# Patient Record
Sex: Female | Born: 1980
Health system: Southern US, Community
[De-identification: ages and names within clinical notes are randomized; demographics above are authoritative.]

## PROBLEM LIST (undated history)

## (undated) DIAGNOSIS — G43909 Migraine, unspecified, not intractable, without status migrainosus: Secondary | ICD-10-CM

## (undated) DIAGNOSIS — N200 Calculus of kidney: Secondary | ICD-10-CM

## (undated) DIAGNOSIS — R51 Headache: Secondary | ICD-10-CM

## (undated) DIAGNOSIS — F329 Major depressive disorder, single episode, unspecified: Secondary | ICD-10-CM

## (undated) DIAGNOSIS — R635 Abnormal weight gain: Secondary | ICD-10-CM

## (undated) DIAGNOSIS — T7840XA Allergy, unspecified, initial encounter: Secondary | ICD-10-CM

## (undated) DIAGNOSIS — J029 Acute pharyngitis, unspecified: Secondary | ICD-10-CM

## (undated) DIAGNOSIS — I471 Supraventricular tachycardia: Secondary | ICD-10-CM

## (undated) DIAGNOSIS — G8929 Other chronic pain: Secondary | ICD-10-CM

## (undated) DIAGNOSIS — R519 Headache, unspecified: Secondary | ICD-10-CM

## (undated) DIAGNOSIS — R21 Rash and other nonspecific skin eruption: Secondary | ICD-10-CM

## (undated) DIAGNOSIS — R897 Abnormal histological findings in specimens from other organs, systems and tissues: Secondary | ICD-10-CM

## (undated) DIAGNOSIS — F419 Anxiety disorder, unspecified: Secondary | ICD-10-CM

## (undated) DIAGNOSIS — M222X9 Patellofemoral disorders, unspecified knee: Secondary | ICD-10-CM

## (undated) DIAGNOSIS — J209 Acute bronchitis, unspecified: Secondary | ICD-10-CM

## (undated) DIAGNOSIS — M25561 Pain in right knee: Secondary | ICD-10-CM

## (undated) DIAGNOSIS — F32A Depression, unspecified: Secondary | ICD-10-CM

## (undated) HISTORY — DX: Headache: R51

## (undated) HISTORY — DX: Calculus of kidney: N20.0

## (undated) HISTORY — DX: Major depressive disorder, single episode, unspecified: F32.9

## (undated) HISTORY — DX: Other chronic pain: G89.29

## (undated) HISTORY — DX: Headache, unspecified: R51.9

## (undated) HISTORY — DX: Depression, unspecified: F32.A

## (undated) HISTORY — PX: BREAST BIOPSY: SHX20

## (undated) HISTORY — DX: Allergy, unspecified, initial encounter: T78.40XA

## (undated) HISTORY — DX: Migraine, unspecified, not intractable, without status migrainosus: G43.909

---
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
## 2002-11-24 DIAGNOSIS — N200 Calculus of kidney: Secondary | ICD-10-CM

## 2002-11-24 HISTORY — DX: Calculus of kidney: N20.0

## 2006-04-09 ENCOUNTER — Other Ambulatory Visit: Admission: RE | Admit: 2006-04-09 | Discharge: 2006-04-09 | Payer: Self-pay | Admitting: Obstetrics & Gynecology

## 2007-08-30 ENCOUNTER — Ambulatory Visit (HOSPITAL_COMMUNITY): Admission: RE | Admit: 2007-08-30 | Discharge: 2007-08-30 | Payer: Self-pay | Admitting: Obstetrics and Gynecology

## 2007-11-16 ENCOUNTER — Inpatient Hospital Stay (HOSPITAL_COMMUNITY): Admission: RE | Admit: 2007-11-16 | Discharge: 2007-11-18 | Payer: Self-pay | Admitting: Obstetrics and Gynecology

## 2008-12-03 ENCOUNTER — Emergency Department (HOSPITAL_COMMUNITY): Admission: EM | Admit: 2008-12-03 | Discharge: 2008-12-03 | Payer: Self-pay | Admitting: Emergency Medicine

## 2010-05-28 ENCOUNTER — Encounter: Admission: RE | Admit: 2010-05-28 | Discharge: 2010-05-28 | Payer: Self-pay | Admitting: Obstetrics and Gynecology

## 2010-08-22 ENCOUNTER — Ambulatory Visit (HOSPITAL_COMMUNITY): Admission: RE | Admit: 2010-08-22 | Discharge: 2010-08-22 | Payer: Self-pay | Admitting: Obstetrics and Gynecology

## 2011-02-03 ENCOUNTER — Inpatient Hospital Stay (HOSPITAL_COMMUNITY)
Admission: AD | Admit: 2011-02-03 | Discharge: 2011-02-05 | DRG: 775 | Disposition: A | Discharge status: Home / Self Care | Payer: Managed Care, Other (non HMO) | Source: Ambulatory Visit | Attending: Obstetrics and Gynecology | Admitting: Obstetrics and Gynecology

## 2011-02-03 DIAGNOSIS — O9912 Other diseases of the blood and blood-forming organs and certain disorders involving the immune mechanism complicating childbirth: Secondary | ICD-10-CM | POA: Diagnosis present

## 2011-02-03 DIAGNOSIS — D696 Thrombocytopenia, unspecified: Secondary | ICD-10-CM | POA: Diagnosis present

## 2011-02-03 DIAGNOSIS — D689 Coagulation defect, unspecified: Secondary | ICD-10-CM | POA: Diagnosis present

## 2011-02-03 LAB — CBC
HCT: 34.1 % — ABNORMAL LOW (ref 36.0–46.0)
Hemoglobin: 11.6 g/dL — ABNORMAL LOW (ref 12.0–15.0)
MCH: 32.6 pg (ref 26.0–34.0)
MCHC: 34 g/dL (ref 30.0–36.0)
MCV: 95.8 fL (ref 78.0–100.0)
Platelets: 142 10*3/uL — ABNORMAL LOW (ref 150–400)
RDW: 13.1 % (ref 11.5–15.5)
WBC: 7.3 10*3/uL (ref 4.0–10.5)

## 2011-02-04 LAB — CBC
MCH: 32.7 pg (ref 26.0–34.0)
Platelets: 128 10*3/uL — ABNORMAL LOW (ref 150–400)
RBC: 3.18 MIL/uL — ABNORMAL LOW (ref 3.87–5.11)
RDW: 13 % (ref 11.5–15.5)
WBC: 9.6 10*3/uL (ref 4.0–10.5)

## 2011-02-06 NOTE — Discharge Summary (Signed)
  [name], Summer Anderson                 ACCOUNT NO.:  [id number]  MEDICAL RECORD NO.:  [id number]           PATIENT TYPE:  I  LOCATION:  9123                          FACILITY:  [facility]  PHYSICIAN:  Gerrit Friends. Aldona Bar, M.D.   DATE OF BIRTH:  1981/04/07  DATE OF ADMISSION:  02/03/2011 DATE OF DISCHARGE:  02/05/2011                              DISCHARGE SUMMARY   DISCHARGE DIAGNOSES: 1. Term pregnancy, delivered, 7-pound and 8-ounce female infant,     Apgars 9 and 9. 2. Blood type A negative - RhoGAM not given - baby Rh negative.  PROCEDURES: 1. Induction of labor. 2. Normal spontaneous delivery. 3. Second-degree tear and repair.  SUMMARY:  This patient, a 30 year old gravida 2, para 1 was admitted for induction by Dr. Tenny Craw on the morning of February 03, 2011, after a relatively uneventful pregnancy.  She progressed and on the evening of February 03, 2011, had a normal spontaneous delivery of 7-pound and 8-ounce female infant with Apgars of 9 and 9 over a second-degree tear which was repaired without difficulty.  Her postpartum course was benign.  Her discharge hemoglobin was 10.4 with a white count of 9600, and a platelet count of 128,000.  On the morning of February 05, 2011, she was ambulating well, tolerating a regular diet well, having normal bowel and bladder function, was afebrile, was breast-feeding without difficulty, and desirous of discharge.  Accordingly, she was given all appropriate instructions per discharge brochure and understood all instructions well.  DISCHARGE MEDICATIONS: 1. Vitamins 1 a day as long as she is breast-feeding. 2. Equivalent Feosol capsules one 3-4 times a week. 3. She was given prescriptions for Motrin 600 mg to use every 6 hours     as needed for mild pain or cramping. 4. Tylox to use 1-2 every 4-6 hours as needed for more severe pain.  She will return to the office in approximately 4 weeks' time for followup or as needed.  CONDITION ON DISCHARGE:   Improved.     Gerrit Friends. Aldona Bar, M.D.     RMW/MEDQ  D:  02/05/2011  T:  02/06/2011  Job:  [id number]  Electronically Signed by Annamaria Helling M.D. on 02/06/2011 05:38:10 AM

## 2011-02-10 ENCOUNTER — Inpatient Hospital Stay (HOSPITAL_COMMUNITY): Admission: AD | Admit: 2011-02-10 | Payer: Self-pay | Admitting: Obstetrics and Gynecology

## 2011-03-10 LAB — CBC
MCV: 96.8 fL (ref 78.0–100.0)
Platelets: 206 10*3/uL (ref 150–400)
RBC: 3.45 MIL/uL — ABNORMAL LOW (ref 3.87–5.11)
RDW: 12 % (ref 11.5–15.5)

## 2011-03-10 LAB — POCT I-STAT, CHEM 8
Calcium, Ion: 1.18 mmol/L (ref 1.12–1.32)
Creatinine, Ser: 1.2 mg/dL (ref 0.4–1.2)
HCT: 34 % — ABNORMAL LOW (ref 36.0–46.0)
Hemoglobin: 11.6 g/dL — ABNORMAL LOW (ref 12.0–15.0)
Potassium: 4.1 mEq/L (ref 3.5–5.1)
Sodium: 141 mEq/L (ref 135–145)

## 2011-03-10 LAB — DIFFERENTIAL
Lymphocytes Relative: 7 % — ABNORMAL LOW (ref 12–46)
Neutro Abs: 12.6 10*3/uL — ABNORMAL HIGH (ref 1.7–7.7)
Neutrophils Relative %: 88 % — ABNORMAL HIGH (ref 43–77)

## 2011-03-10 LAB — URINALYSIS, ROUTINE W REFLEX MICROSCOPIC
Glucose, UA: NEGATIVE mg/dL
Nitrite: POSITIVE — AB
Protein, ur: 100 mg/dL — AB
Urobilinogen, UA: 1 mg/dL (ref 0.0–1.0)

## 2011-08-29 LAB — URINALYSIS, DIPSTICK ONLY
Bilirubin Urine: NEGATIVE
Glucose, UA: NEGATIVE
Leukocytes, UA: NEGATIVE
Nitrite: NEGATIVE
Protein, ur: NEGATIVE
Specific Gravity, Urine: 1.015

## 2011-08-29 LAB — COMPREHENSIVE METABOLIC PANEL
ALT: 20
Albumin: 2.6 — ABNORMAL LOW
BUN: 9
CO2: 20
Chloride: 104
GFR calc non Af Amer: >60
Glucose, Bld: 86
Total Bilirubin: 0.7
Total Protein: 5.3 — ABNORMAL LOW

## 2011-08-29 LAB — CBC
MCHC: 35
MCHC: 35.2
MCV: 94.1
MCV: 95.6
Platelets: 170
RBC: 3.21 — ABNORMAL LOW
WBC: 17.8 — ABNORMAL HIGH
WBC: 9.5

## 2011-08-29 LAB — LACTATE DEHYDROGENASE: LDH: 160

## 2011-09-04 LAB — RH IMMUNE GLOBULIN WORKUP (NOT WOMEN'S HOSP): Antibody Screen: NEGATIVE

## 2011-09-04 LAB — CBC
HCT: 33.9 — ABNORMAL LOW
MCV: 95.4
Platelets: 232
RBC: 3.56 — ABNORMAL LOW
RDW: 12.4

## 2011-09-04 LAB — RPR: RPR Ser Ql: NONREACTIVE

## 2013-06-16 LAB — HM PAP SMEAR: HM Pap smear: NORMAL

## 2013-06-21 ENCOUNTER — Encounter: Payer: Self-pay | Admitting: Internal Medicine

## 2013-06-21 ENCOUNTER — Ambulatory Visit (INDEPENDENT_AMBULATORY_CARE_PROVIDER_SITE_OTHER): Payer: Managed Care, Other (non HMO) | Admitting: Internal Medicine

## 2013-06-21 VITALS — BP 112/70 | HR 90 | Temp 98.5°F | Ht 64.0 in | Wt 120.8 lb

## 2013-06-21 DIAGNOSIS — F329 Major depressive disorder, single episode, unspecified: Secondary | ICD-10-CM

## 2013-06-21 DIAGNOSIS — G43909 Migraine, unspecified, not intractable, without status migrainosus: Secondary | ICD-10-CM | POA: Insufficient documentation

## 2013-06-21 DIAGNOSIS — Z0000 Encounter for general adult medical examination without abnormal findings: Secondary | ICD-10-CM

## 2013-06-21 MED ORDER — ESCITALOPRAM OXALATE 10 MG PO TABS: 10.0000 mg | ORAL_TABLET | Freq: Every day | ORAL | Status: DC

## 2013-06-21 MED ORDER — LEVONORGESTREL 20 MCG/24HR IU IUD: 1.0000 | INTRAUTERINE_SYSTEM | Freq: Once | INTRAUTERINE | Status: AC

## 2013-06-21 MED ORDER — CLINDAMYCIN-TRETINOIN 1.2-0.025 % EX GEL: Freq: Every day | CUTANEOUS | Status: DC

## 2013-06-21 MED ORDER — ALPRAZOLAM 0.25 MG PO TABS: 0.2500 mg | ORAL_TABLET | Freq: Three times a day (TID) | ORAL | Status: DC | PRN

## 2013-06-21 NOTE — Progress Notes (Signed)
  Subjective:    Patient ID: Summer Anderson, female    DOB: May 05, 1981, 32 y.o.   MRN: [id number]  HPI  patient is here today for annual physical. Patient feels well and has no complaints.  Past Medical History  Diagnosis Date  . Depression   . Migraines   . Kidney stones   . Allergy   . Chronic headaches    Family History  Problem Relation Age of Onset  . Acute myelogenous leukemia Father 29  . Parkinson's disease Maternal Grandfather     uncetain dx   History  Substance Use Topics  . Smoking status: Never Smoker   . 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 Alcohol Use: Yes    Review of Systems Constitutional: Negative for fever or weight change.  Respiratory: Negative for cough and shortness of breath.   Cardiovascular: Negative for chest pain or palpitations.  Gastrointestinal: Negative for abdominal pain, no bowel changes.  Musculoskeletal: Negative for gait problem or joint swelling.  Skin: Negative for rash.  Neurological: Negative for dizziness or change in chronic headache.  No other specific complaints in a complete review of systems (except as listed in HPI above).     Objective:   Physical Exam BP 112/70  Pulse 90  Temp(Src) 98.5 F (36.9 C) (Oral)  Ht 5\' 4"  (1.626 m)  Wt 120 lb 12.8 oz (54.795 kg)  BMI 20.73 kg/m2  SpO2 98% Wt Readings from Last 3 Encounters:  06/21/13 120 lb 12.8 oz (54.795 kg)  Constitutional: She appears well-developed and well-nourished. No distress.  HENT: Head: Normocephalic and atraumatic. Ears: B TMs ok, no erythema or effusion; Nose: Nose normal. Mouth/Throat: Oropharynx is clear and moist. No oropharyngeal exudate.  Eyes: Conjunctivae and EOM are normal. Pupils are equal, round, and reactive to light. No scleral icterus.  Neck: Normal range of motion. Neck supple. No JVD present. No thyromegaly present.  Cardiovascular: Normal rate, regular rhythm and normal  heart sounds.  No murmur heard. No BLE edema. Pulmonary/Chest: Effort normal and breath sounds normal. No respiratory distress. She has no wheezes.  Abdominal: Soft. Bowel sounds are normal. She exhibits no distension. There is no tenderness. no masses Musculoskeletal: Normal range of motion, no joint effusions. No gross deformities Neurological: She is alert and oriented to person, place, and time. No cranial nerve deficit. Coordination, balance, strength, speech and gait are normal.  Skin: Skin is warm and dry. No rash noted. No erythema.  Psychiatric: She has a normal mood and affect. Her behavior is normal. Judgment and thought content normal.   Lab Results  Component Value Date   WBC 9.6 02/04/2011   HGB 10.4* 02/04/2011   HCT 30.6* 02/04/2011   PLT 128* 02/04/2011   GLUCOSE 107* 12/03/2008   ALT 20 11/16/2007   AST 26 11/16/2007   NA 141 12/03/2008   K 4.1 12/03/2008   CL 110 12/03/2008   CREATININE 1.2 12/03/2008   BUN 14 12/03/2008   CO2 20 11/16/2007        Assessment & Plan:   CPX/v70.0 - Patient has been counseled on age-appropriate routine health concerns for screening and prevention. These are reviewed and up-to-date. Immunizations are up-to-date or declined. Labs ordered and reviewed.   See problem list. Medications and labs reviewed today.

## 2013-06-21 NOTE — Patient Instructions (Signed)
It was good to see you today. We have reviewed your prior records including labs and tests today Health Maintenance reviewed - all recommended immunizations and age-appropriate screenings are up-to-date.  Test(s) ordered today. Your results will be released to MyChart (or called to you) after review, usually within 72hours after test completion. If any changes need to be made, you will be notified at that same time. Medications reviewed and updated, no changes recommended at this time. Refill on medication(s) as discussed today. Please schedule followup in 12 months, call sooner if problems.  Health Maintenance, Females A healthy lifestyle and preventative care can promote health and wellness.  Maintain regular health, dental, and eye exams.  Eat a healthy diet. Foods like vegetables, fruits, whole grains, low-fat dairy products, and lean protein foods contain the nutrients you need without too many calories. Decrease your intake of foods high in solid fats, added sugars, and salt. Get information about a proper diet from your caregiver, if necessary.  Regular physical exercise is one of the most important things you can do for your health. Most adults should get at least 150 minutes of moderate-intensity exercise (any activity that increases your heart rate and causes you to sweat) each week. In addition, most adults need muscle-strengthening exercises on 2 or more days a week.   Maintain a healthy weight. The body mass index (BMI) is a screening tool to identify possible weight problems. It provides an estimate of body fat based on height and weight. Your caregiver can help determine your BMI, and can help you achieve or maintain a healthy weight. For adults 20 years and older:  A BMI below 18.5 is considered underweight.  A BMI of 18.5 to 24.9 is normal.  A BMI of 25 to 29.9 is considered overweight.  A BMI of 30 and above is considered obese.  Maintain normal blood lipids and  cholesterol by exercising and minimizing your intake of saturated fat. Eat a balanced diet with plenty of fruits and vegetables. Blood tests for lipids and cholesterol should begin at age 21 and be repeated every 5 years. If your lipid or cholesterol levels are high, you are over 50, or you are a high risk for heart disease, you may need your cholesterol levels checked more frequently.Ongoing high lipid and cholesterol levels should be treated with medicines if diet and exercise are not effective.  If you smoke, find out from your caregiver how to quit. If you do not use tobacco, do not start.  If you are pregnant, do not drink alcohol. If you are breastfeeding, be very cautious about drinking alcohol. If you are not pregnant and choose to drink alcohol, do not exceed 1 drink per day. One drink is considered to be 12 ounces (355 mL) of beer, 5 ounces (148 mL) of wine, or 1.5 ounces (44 mL) of liquor.  Avoid use of street drugs. Do not share needles with anyone. Ask for help if you need support or instructions about stopping the use of drugs.  High blood pressure causes heart disease and increases the risk of stroke. Blood pressure should be checked at least every 1 to 2 years. Ongoing high blood pressure should be treated with medicines, if weight loss and exercise are not effective.  If you are 77 to 33 years old, ask your caregiver if you should take aspirin to prevent strokes.  Diabetes screening involves taking a blood sample to check your fasting blood sugar level. This should be done once every 3  years, after age 40, if you are within normal weight and without risk factors for diabetes. Testing should be considered at a younger age or be carried out more frequently if you are overweight and have at least 1 risk factor for diabetes.  Breast cancer screening is essential preventative care for women. You should practice "breast self-awareness." This means understanding the normal appearance and  feel of your breasts and may include breast self-examination. Any changes detected, no matter how small, should be reported to a caregiver. Women in their 11s and 30s should have a clinical breast exam (CBE) by a caregiver as part of a regular health exam every 1 to 3 years. After age 77, women should have a CBE every year. Starting at age 15, women should consider having a mammogram (breast X-ray) every year. Women who have a family history of breast cancer should talk to their caregiver about genetic screening. Women at a high risk of breast cancer should talk to their caregiver about having an MRI and a mammogram every year.  The Pap test is a screening test for cervical cancer. Women should have a Pap test starting at age 60. Between ages 25 and 59, Pap tests should be repeated every 2 years. Beginning at age 65, you should have a Pap test every 3 years as long as the past 3 Pap tests have been normal. If you had a hysterectomy for a problem that was not cancer or a condition that could lead to cancer, then you no longer need Pap tests. If you are between ages 74 and 5, and you have had normal Pap tests going back 10 years, you no longer need Pap tests. If you have had past treatment for cervical cancer or a condition that could lead to cancer, you need Pap tests and screening for cancer for at least 20 years after your treatment. If Pap tests have been discontinued, risk factors (such as a new sexual partner) need to be reassessed to determine if screening should be resumed. Some women have medical problems that increase the chance of getting cervical cancer. In these cases, your caregiver may recommend more frequent screening and Pap tests.  The human papillomavirus (HPV) test is an additional test that may be used for cervical cancer screening. The HPV test looks for the virus that can cause the cell changes on the cervix. The cells collected during the Pap test can be tested for HPV. The HPV test could  be used to screen women aged 24 years and older, and should be used in women of any age who have unclear Pap test results. After the age of 86, women should have HPV testing at the same frequency as a Pap test.  Colorectal cancer can be detected and often prevented. Most routine colorectal cancer screening begins at the age of 45 and continues through age 2. However, your caregiver may recommend screening at an earlier age if you have risk factors for colon cancer. On a yearly basis, your caregiver may provide home test kits to check for hidden blood in the stool. Use of a small camera at the end of a tube, to directly examine the colon (sigmoidoscopy or colonoscopy), can detect the earliest forms of colorectal cancer. Talk to your caregiver about this at age 62, when routine screening begins. Direct examination of the colon should be repeated every 5 to 10 years through age 51, unless early forms of pre-cancerous polyps or small growths are found.  Hepatitis C blood  testing is recommended for all people born from [birth year] through 1965 and any individual with known risks for hepatitis C.  Practice safe sex. Use condoms and avoid high-risk sexual practices to reduce the spread of sexually transmitted infections (STIs). Sexually active women aged 18 and younger should be checked for Chlamydia, which is a common sexually transmitted infection. Older women with new or multiple partners should also be tested for Chlamydia. Testing for other STIs is recommended if you are sexually active and at increased risk.  Osteoporosis is a disease in which the bones lose minerals and strength with aging. This can result in serious bone fractures. The risk of osteoporosis can be identified using a bone density scan. Women ages 90 and over and women at risk for fractures or osteoporosis should discuss screening with their caregivers. Ask your caregiver whether you should be taking a calcium supplement or vitamin D to reduce the  rate of osteoporosis.  Menopause can be associated with physical symptoms and risks. Hormone replacement therapy is available to decrease symptoms and risks. You should talk to your caregiver about whether hormone replacement therapy is right for you.  Use sunscreen with a sun protection factor (SPF) of 30 or greater. Apply sunscreen liberally and repeatedly throughout the day. You should seek shade when your shadow is shorter than you. Protect yourself by wearing long sleeves, pants, a wide-brimmed hat, and sunglasses year round, whenever you are outdoors.  Notify your caregiver of new moles or changes in moles, especially if there is a change in shape or color. Also notify your caregiver if a mole is larger than the size of a pencil eraser.  Stay current with your immunizations. Document Released: 05/26/2011 Document Revised: 02/02/2012 Document Reviewed: 05/26/2011 [facility] Patient Information 2014 [place], [place].

## 2013-06-21 NOTE — Assessment & Plan Note (Signed)
Situational, related to relationship stressors and employment stressors. In counseling at this time, considering separation from spouse On citalopram and low-dose when necessary Xanax from a gynecologist since October 2013, reports symptoms controlled/stable The current medical regimen is effective;  continue present plan and medications.  Will refill here as primary care provider, patient to call if symptoms change or needed dose adjustment

## 2013-06-21 NOTE — Assessment & Plan Note (Signed)
Follows with neurology specialist for management of same Symptoms stable, continue medications as ongoing

## 2013-06-23 ENCOUNTER — Ambulatory Visit: Payer: Managed Care, Other (non HMO) | Admitting: Internal Medicine

## 2013-06-29 ENCOUNTER — Ambulatory Visit: Payer: Managed Care, Other (non HMO) | Admitting: Internal Medicine

## 2013-07-07 ENCOUNTER — Other Ambulatory Visit (INDEPENDENT_AMBULATORY_CARE_PROVIDER_SITE_OTHER): Payer: Managed Care, Other (non HMO)

## 2013-07-07 DIAGNOSIS — Z0000 Encounter for general adult medical examination without abnormal findings: Secondary | ICD-10-CM

## 2013-07-07 LAB — CBC WITH DIFFERENTIAL/PLATELET
Basophils Relative: 0.4 % (ref 0.0–3.0)
Eosinophils Absolute: 0 10*3/uL (ref 0.0–0.7)
HCT: 37.6 % (ref 36.0–46.0)
Hemoglobin: 12.7 g/dL (ref 12.0–15.0)
Lymphs Abs: 1.8 10*3/uL (ref 0.7–4.0)
MCHC: 33.8 g/dL (ref 30.0–36.0)
MCV: 98.9 fl (ref 78.0–100.0)
Monocytes Absolute: 0.6 10*3/uL (ref 0.1–1.0)
Neutro Abs: 3.8 10*3/uL (ref 1.4–7.7)
RBC: 3.8 Mil/uL — ABNORMAL LOW (ref 3.87–5.11)

## 2013-07-07 LAB — BASIC METABOLIC PANEL
CO2: 23 mEq/L (ref 19–32)
Chloride: 106 mEq/L (ref 96–112)
Glucose, Bld: 86 mg/dL (ref 70–99)
Sodium: 137 mEq/L (ref 135–145)

## 2013-07-07 LAB — LIPID PANEL
HDL: 47 mg/dL (ref >39.00)
Total CHOL/HDL Ratio: 4
VLDL: 17.4 mg/dL (ref 0.0–40.0)

## 2013-07-07 LAB — HEPATIC FUNCTION PANEL
Albumin: 4.5 g/dL (ref 3.5–5.2)
Total Bilirubin: 0.9 mg/dL (ref 0.3–1.2)

## 2013-07-07 LAB — URINALYSIS, ROUTINE W REFLEX MICROSCOPIC
Bilirubin Urine: NEGATIVE
Hgb urine dipstick: NEGATIVE
Ketones, ur: NEGATIVE
RBC / HPF: NONE SEEN (ref 0–?)
Specific Gravity, Urine: 1.015 (ref 1.000–1.030)
Urine Glucose: NEGATIVE
Urobilinogen, UA: 0.2 (ref 0.0–1.0)

## 2013-07-07 LAB — TSH: TSH: 0.82 u[IU]/mL (ref 0.35–5.50)

## 2013-07-15 ENCOUNTER — Telehealth: Payer: Self-pay | Admitting: *Deleted

## 2013-07-15 MED ORDER — ESCITALOPRAM OXALATE 20 MG PO TABS: 20.0000 mg | ORAL_TABLET | Freq: Every day | ORAL | Status: DC

## 2013-07-15 NOTE — Telephone Encounter (Signed)
Called pt and okay to increase Lexapro dose to 20 mg daily, erx done. OV if your symptoms worsen or unimproved with medication change.

## 2013-07-15 NOTE — Telephone Encounter (Signed)
Okay to increase Lexapro dose to 20 mg daily, erx done OV if symptoms worsening or unimproved with medication change

## 2013-07-15 NOTE — Telephone Encounter (Signed)
Pt called and lvm requesting an increase in dosage of her lexapro. She feels she may need it. Call back #[phone].

## 2013-08-18 ENCOUNTER — Other Ambulatory Visit: Payer: Self-pay | Admitting: *Deleted

## 2013-08-18 DIAGNOSIS — G43909 Migraine, unspecified, not intractable, without status migrainosus: Secondary | ICD-10-CM

## 2013-08-25 ENCOUNTER — Ambulatory Visit: Payer: Managed Care, Other (non HMO) | Attending: Oncology | Admitting: Physical Therapy

## 2013-08-25 DIAGNOSIS — M542 Cervicalgia: Secondary | ICD-10-CM | POA: Insufficient documentation

## 2013-08-25 DIAGNOSIS — IMO0001 Reserved for inherently not codable concepts without codable children: Secondary | ICD-10-CM | POA: Insufficient documentation

## 2013-08-25 DIAGNOSIS — R5381 Other malaise: Secondary | ICD-10-CM | POA: Insufficient documentation

## 2013-08-30 ENCOUNTER — Ambulatory Visit: Payer: Managed Care, Other (non HMO) | Admitting: Physical Therapy

## 2013-09-05 ENCOUNTER — Ambulatory Visit: Payer: Managed Care, Other (non HMO) | Admitting: Physical Therapy

## 2013-09-09 ENCOUNTER — Ambulatory Visit: Payer: Managed Care, Other (non HMO) | Admitting: Physical Therapy

## 2013-09-12 ENCOUNTER — Ambulatory Visit: Payer: Managed Care, Other (non HMO) | Admitting: Physical Therapy

## 2013-09-15 ENCOUNTER — Encounter: Payer: Managed Care, Other (non HMO) | Admitting: Physical Therapy

## 2013-09-19 ENCOUNTER — Ambulatory Visit: Payer: Managed Care, Other (non HMO) | Admitting: Physical Therapy

## 2013-09-22 ENCOUNTER — Encounter: Payer: Managed Care, Other (non HMO) | Admitting: Physical Therapy

## 2013-09-26 ENCOUNTER — Ambulatory Visit: Payer: Managed Care, Other (non HMO) | Admitting: Physical Therapy

## 2013-09-29 ENCOUNTER — Encounter: Payer: Managed Care, Other (non HMO) | Admitting: Physical Therapy

## 2013-10-31 ENCOUNTER — Telehealth: Payer: Self-pay | Admitting: *Deleted

## 2013-10-31 NOTE — Telephone Encounter (Signed)
Patient Information:  Caller Name: Ladeana  Phone: [phone]  Patient: Summer Anderson, Summer Anderson  Gender: Female  DOB: 17-Oct-1981  Age: 32 Years  PCP: Rene Paci (Adults only)  Pregnant: No  Office Follow Up:  Does the office need to follow up with this patient?: Yes  Instructions For The Office: Patient needs further instructions.  RN Note:  Patient had left message earlier explaining symptoms. Note was sent to Dr. Felicity Coyer and she recommends an office visit. Explained this to the patient. There are no appointments available today or tomorrow with Dr. Felicity Coyer. Patient would prefer to be seen here in this office instead of her neurologist. Please contact patient to schedule an appointment.  Symptoms  Reason For Call & Symptoms: Reports she has been saying different words than she means without realizing it. Also reports double vision at times. Had an involuntary movement in her arm through the weekend. Blood pressure 103/69 today.  Reviewed Health History In EMR: Yes  Reviewed Medications In EMR: Yes  Reviewed Allergies In EMR: Yes  Reviewed Surgeries / Procedures: Yes  Date of Onset of Symptoms: 10/29/2013 OB / GYN:  LMP: Unknown  Guideline(s) Used:  No Protocol Available - Sick Adult  Disposition Per Guideline:   Discuss with PCP and Callback by Nurse within 1 Hour  Reason For Disposition Reached:   Nursing judgment  Advice Given:  N/A  Patient Will Follow Care Advice:  YES

## 2013-10-31 NOTE — Telephone Encounter (Signed)
left message on VM for pt to return call

## 2013-10-31 NOTE — Telephone Encounter (Signed)
Pt called states she is having Neurological difficulties; states she is having trouble saying the correct thing.  Please advise

## 2013-10-31 NOTE — Telephone Encounter (Signed)
I recommended OV to eval same - here or with her neurologist (migraine doc) thanks

## 2013-11-01 NOTE — Telephone Encounter (Signed)
Pt scheduled 12.15.14 @ 1:30pm

## 2013-11-07 ENCOUNTER — Encounter: Payer: Self-pay | Admitting: Internal Medicine

## 2013-11-07 ENCOUNTER — Ambulatory Visit (INDEPENDENT_AMBULATORY_CARE_PROVIDER_SITE_OTHER): Payer: Managed Care, Other (non HMO) | Admitting: Internal Medicine

## 2013-11-07 VITALS — BP 110/72 | HR 86 | Temp 98.1°F | Wt 130.4 lb

## 2013-11-07 DIAGNOSIS — F329 Major depressive disorder, single episode, unspecified: Secondary | ICD-10-CM

## 2013-11-07 DIAGNOSIS — G43909 Migraine, unspecified, not intractable, without status migrainosus: Secondary | ICD-10-CM

## 2013-11-07 DIAGNOSIS — R4701 Aphasia: Secondary | ICD-10-CM

## 2013-11-07 DIAGNOSIS — R479 Unspecified speech disturbances: Secondary | ICD-10-CM

## 2013-11-07 DIAGNOSIS — R4789 Other speech disturbances: Secondary | ICD-10-CM

## 2013-11-07 NOTE — Assessment & Plan Note (Signed)
Has followed with neurology specialist Neale Burly) for management of same, but requests new provider Updated medication changes since last visit- continue medications as ongoing without additional titration or change at this time.  Reports remote Topamax ineffective

## 2013-11-07 NOTE — Progress Notes (Signed)
Pre-visit discussion using our clinic review tool. No additional management support is needed unless otherwise documented below in the visit note.  

## 2013-11-07 NOTE — Patient Instructions (Signed)
It was good to see you today.  we'll make referral to new neurologist. Our office will contact you regarding appointment(s) once made.  Also refer for MRI/MRA brain to evaluate anatomy given new speech problems. My office will call regarding this appointment once scheduled. You will been contact with results once reviewed  Medications reviewed and updated, no changes recommended at this time.

## 2013-11-07 NOTE — Assessment & Plan Note (Signed)
Situational, related to relationship stressors and employment stressors. In counseling at this time Reports improvement as compared to earlier this year On citalopram and low-dose when necessary Xanax since October 2013 Has been self weaning Lexapro because symptoms stable/improved I advised against further dose reduction until new neuro symptoms stabilize. Patient understands and agrees to same

## 2013-11-07 NOTE — Progress Notes (Signed)
Subjective:    Patient ID: Summer Anderson, female    DOB: 1981-03-27, 31 y.o.   MRN: [id number]  HPI  complains of speech problems: unable to name animal in daughter's picture book, unable to order from menu, using wrong word to describe winter clothing  Sudden onset of problems 10 days ago  Reports problem has been noted by spouse, her children and her coworkers  Episodes of speech problem occur daily  Associated with increase in visual auras, but no increase in migraine or head pain  Denies medication changes, interval updates reviewed Denies new stressors, no head trauma  Reports no alcohol use in past 2 weeks since unexplained "myoclonic" jerk of R hand spilled her glass of wine while sitting with spouse at home  Past Medical History  Diagnosis Date  . Depression   . Migraines   . Kidney stones   . Allergy   . Chronic headaches     Review of Systems  Constitutional: Negative for fever, fatigue and unexpected weight change.  Respiratory: Negative for cough and shortness of breath.   Neurological: Positive for speech difficulty. Negative for dizziness, tremors, seizures, syncope, facial asymmetry, weakness, light-headedness, numbness and headaches.  Psychiatric/Behavioral: Negative for suicidal ideas, self-injury, dysphoric mood and decreased concentration. The patient is not nervous/anxious.        Objective:   Physical Exam BP 110/72  Pulse 86  Temp(Src) 98.1 F (36.7 C) (Oral)  Wt 130 lb 6.4 oz (59.149 kg)  SpO2 97% Wt Readings from Last 3 Encounters:  11/07/13 130 lb 6.4 oz (59.149 kg)  06/21/13 120 lb 12.8 oz (54.795 kg)   Constitutional: She appears well-developed and well-nourished. No distress.  HENT: Head: Normocephalic and atraumatic. Ears: B TMs ok, no erythema or effusion; Nose: Nose normal. Mouth/Throat: Oropharynx is clear and moist. No oropharyngeal exudate.  Eyes: Conjunctivae and EOM are normal. Pupils are equal, round, and reactive to light. No  scleral icterus.  Neck: Normal range of motion. Neck supple. No JVD present. No thyromegaly present.  Cardiovascular: Normal rate, regular rhythm and normal heart sounds.  No murmur heard. No BLE edema. Pulmonary/Chest: Effort normal and breath sounds normal. No respiratory distress. She has no wheezes.  Neurological: She is alert and oriented to person, place, and time. No cranial nerve deficit. Coordination, balance, strength, and gait are normal. speech is fluent with occasional aphasia, but comprehensive. Skin: Skin is warm and dry. No rash noted. No erythema.  Psychiatric: She has a normal mood and affect. Her behavior is normal. Judgment and thought content normal.   Lab Results  Component Value Date   WBC 6.2 07/07/2013   HGB 12.7 07/07/2013   HCT 37.6 07/07/2013   PLT 219.0 07/07/2013   GLUCOSE 86 07/07/2013   CHOL 173 07/07/2013   TRIG 87.0 07/07/2013   HDL 47.00 07/07/2013   LDLCALC 109* 07/07/2013   ALT 12 07/07/2013   AST 16 07/07/2013   NA 137 07/07/2013   K 4.0 07/07/2013   CL 106 07/07/2013   CREATININE 0.8 07/07/2013   BUN 16 07/07/2013   CO2 23 07/07/2013   TSH 0.82 07/07/2013       Assessment & Plan:    Difficulty word finding,  ?expressive aphasia intermittent but daily occurrence for past 2 weeks  New neuro problem, different than prior typical migraine symptoms  Increase in visual auras, but no increase in pain/headache events   Neuro exam today with word finding difficulty, speech otherwise fluent. cranial nerves intact and  memory/recall normal  Check MRI/MRI of brain to evaluate for stroke, anatomy change or aneurysm ( reports last imaging greater than 3 years ago normal, not in our EMR)  Refer to new neurologist at patient request  Advised against medication changes at this time, no further weaning of antidepressant, no alcohol and no additional changes in zonisamide dose

## 2013-11-09 ENCOUNTER — Encounter: Payer: Self-pay | Admitting: Internal Medicine

## 2013-11-10 NOTE — Telephone Encounter (Signed)
Please check on MRI appt thanks

## 2013-11-10 NOTE — Telephone Encounter (Signed)
Given msg to [facility] to follow-up on appt...Raechel Chute

## 2013-11-15 ENCOUNTER — Ambulatory Visit
Admission: RE | Admit: 2013-11-15 | Discharge: 2013-11-15 | Disposition: A | Discharge status: Home / Self Care | Payer: Managed Care, Other (non HMO) | Source: Ambulatory Visit | Attending: Internal Medicine | Admitting: Internal Medicine

## 2013-11-15 ENCOUNTER — Encounter: Payer: Self-pay | Admitting: Internal Medicine

## 2013-11-15 DIAGNOSIS — R479 Unspecified speech disturbances: Secondary | ICD-10-CM

## 2013-11-15 DIAGNOSIS — G43909 Migraine, unspecified, not intractable, without status migrainosus: Secondary | ICD-10-CM

## 2013-11-15 DIAGNOSIS — R4701 Aphasia: Secondary | ICD-10-CM

## 2013-12-09 ENCOUNTER — Encounter: Payer: Self-pay | Admitting: Neurology

## 2013-12-09 ENCOUNTER — Ambulatory Visit (INDEPENDENT_AMBULATORY_CARE_PROVIDER_SITE_OTHER): Payer: Managed Care, Other (non HMO) | Admitting: Neurology

## 2013-12-09 VITALS — BP 100/72 | HR 74 | Temp 97.7°F | Ht 64.0 in | Wt 127.0 lb

## 2013-12-09 DIAGNOSIS — R4701 Aphasia: Secondary | ICD-10-CM

## 2013-12-09 NOTE — Patient Instructions (Addendum)
1.  Formal neuropsychiatric testing. You have been referred to [facility]. They will call you directly to schedule an appointment.  Please call [phone] if you do not hear from them.  2.  Routine EEG. [facility] and call you with an appointment.  3.  Check vitamin B12, vitamin E 4.  Return to clinic in 6-weeks

## 2013-12-09 NOTE — Progress Notes (Signed)
[facility] Neurology Division Clinic Note - Initial Visit   Date: 12/09/2013    Summer Anderson MRN: [id number] DOB: 07-01-1981   Dear Dr Felicity Coyer:  Thank you for your kind referral of Summer Anderson for consultation of aphasia. Although her history is well known to you, please allow [place] to reiterate it for the purpose of our medical record. The patient was accompanied to the clinic by self.    History of Present Illness: Summer Anderson is a 33 y.o. right-handed Caucasian female with history of depression and migraines presenting for evaluation of aphasia.    In December 2014, people around her started noticing that she would misname things and use the wrong words when talking.  She is unaware that she has mispoken until someone corrects her or points out that she incorrectly said the wrong word.  For instance, she may say "come sit on the couch", but said "porch".  When she is focused, speech is not a problem, but when she is in a more casual environment, she feels that it makes her symptoms.  There is no associated headaches.  She has not had problems reading or writing.  She feels that comprehension is to verbal commands is slightly impaired because it takes her longer to understand things than before.  She complains of short-term memory problems and is making lists and checking calender.    In the past three weeks, she has intermittent tingling over the tips of her fingers, nausea, and feeling of seeing things "slow down".  Symptoms are daily.  No recent illnesses.    She sees Dr. Neale Burly for migraines and was started on zonisamide in 2013 when she initially developed word-finding problems, but that resolved. Prior headache medications include: Fioricet, Imitrex, Maxalt, Relpax, NSAIDs, baclofen, topiramate, escitalopram, and nerve blocks.  Out-side paper records, electronic medical record, and images have been reviewed where available and summarized as:  MRI/A brain wo contrast  11/15/2013:  Negative study   Past Medical History  Diagnosis Date  . Depression   . Migraines   . Kidney stones   . Allergy   . Chronic headaches     Past Surgical History  Procedure Laterality Date  . No past surgeries       Medications:  Current Outpatient Prescriptions on File Prior to Visit  Medication Sig Dispense Refill  . ACZONE 5 % topical gel Apply at bedtime      . ALPRAZolam (XANAX) 0.25 MG tablet Take 1 tablet (0.25 mg total) by mouth 3 (three) times daily as needed for sleep.  30 tablet  5  . Ascorbic Acid (VITAMIN C) 1000 MG tablet Take 2,000 mg by mouth daily.      . Calcium Carb-Cholecalciferol (CALCIUM 600/VITAMIN D3) 600-800 MG-UNIT TABS Take by mouth 2 (two) times daily.      Marland Kitchen escitalopram (LEXAPRO) 10 MG tablet Take 10 mg by mouth daily.      Marland Kitchen levonorgestrel (MIRENA) 20 MCG/24HR IUD 1 Intra Uterine Device (1 each total) by Intrauterine route once.  1 each  0  . Magnesium 250 MG TABS Take by mouth 2 (two) times daily.      . Multiple Vitamins-Minerals (HAIR/SKIN/NAILS PO) Take 1 tablet by mouth daily.      Marland Kitchen zonisamide (ZONEGRAN) 100 MG capsule Take 300 mg by mouth daily.        No current facility-administered medications on file prior to visit.    Allergies: No Known Allergies  Family History: Family History  Problem Relation  Age of Onset  . Acute myelogenous leukemia Father [id number]  . Parkinson's disease Maternal Grandfather     uncetain dx    Social History: History   Social History  . Marital Status: Married    Spouse Name: N/A    Number of Children: N/A  . Years of Education: N/A   Occupational History  . Not on file.   Social History Main Topics  . Smoking status: Never Smoker   . 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 Alcohol Use: Yes  . Drug Use: No  . Sexual Activity: Not on file   Other Topics Concern  . Not on file   Social History Narrative  . No  narrative on file    Review of Systems:  CONSTITUTIONAL: No fevers, chills, night sweats, or weight loss.   EYES: No visual changes or eye pain ENT: No hearing changes.  No history of nose bleeds.   RESPIRATORY: No cough, wheezing and shortness of breath.   CARDIOVASCULAR: Negative for chest pain, and palpitations.   GI: Negative for abdominal discomfort, blood in stools or black stools.  No recent change in bowel habits.   GU:  No history of incontinence.   MUSCLOSKELETAL: No history of joint pain or swelling.  No myalgias.   SKIN: Negative for lesions, rash, and itching.   HEMATOLOGY/ONCOLOGY: Negative for prolonged bleeding, bruising easily, and swollen nodes.  No history of cancer.   ENDOCRINE: Negative for cold or heat intolerance, polydipsia or goiter.   PSYCH:  No depression or anxiety symptoms.   NEURO: As Above.   Vital Signs:  BP 100/72  Pulse 74  Temp(Src) 97.7 F (36.5 C) (Oral)  Ht 5\' 4"  (1.626 m)  Wt 127 lb (57.607 kg)  BMI 21.79 kg/m2   General Medical Exam:   General:  Well appearing, comfortable, tearful at times Eyes/ENT: see cranial nerve examination.   Neck: No masses appreciated.  Full range of motion without tenderness.  No carotid bruits. Respiratory:  Clear to auscultation, good air entry bilaterally.   Cardiac:  Regular rate and rhythm, no murmur.   Back:  No pain to palpation of spinous processes.   Extremities:  No deformities, edema, or skin discoloration. Good capillary refill.   Skin:  Skin color, texture, turgor normal. No rashes or lesions.  Neurological Exam: MENTAL STATUS including orientation to time, place, person, recent and remote memory, attention span and concentration, language, and fund of knowledge is normal.  Speech is not dysarthric, there is intermittent phonemic paraphasia with naming.  Comprehension slightly delayed for commands.  Reading and repetition intact.  MOCA: 23/30 (-2 visuospatial, -2 language, -1 abstraction, -2  recall)  CRANIAL NERVES: II:  No visual field defects.  Unremarkable fundi.   III-IV-VI: Pupils equal round and reactive to light.  Normal conjugate, extra-ocular eye movements in all directions of gaze.  No nystagmus.  No ptosis.   V:  Normal facial sensation.  Jaw jerk is absent.   VII:  Normal facial symmetry and movements.  No pathologic facial reflexes.  VIII:  Normal hearing and vestibular function.   IX-X:  Normal palatal movement.   XI:  Normal shoulder shrug and head rotation.   XII:  Normal tongue strength and range of motion, no deviation or fasciculation.  MOTOR:  No atrophy, fasciculations or abnormal movements.  No pronator drift.  Tone is normal.    Right Upper  Extremity:    Left Upper Extremity:    Deltoid  5/5   Deltoid  5/5   Biceps  5/5   Biceps  5/5   Triceps  5/5   Triceps  5/5   Wrist extensors  5/5   Wrist extensors  5/5   Wrist flexors  5/5   Wrist flexors  5/5   Finger extensors  5/5   Finger extensors  5/5   Finger flexors  5/5   Finger flexors  5/5   Dorsal interossei  5/5   Dorsal interossei  5/5   Abductor pollicis  5/5   Abductor pollicis  5/5   Tone (Ashworth scale)  0  Tone (Ashworth scale)  0   Right Lower Extremity:    Left Lower Extremity:    Hip flexors  5/5   Hip flexors  5/5   Hip extensors  5/5   Hip extensors  5/5   Knee flexors  5/5   Knee flexors  5/5   Knee extensors  5/5   Knee extensors  5/5   Dorsiflexors  5/5   Dorsiflexors  5/5   Plantarflexors  5/5   Plantarflexors  5/5   Toe extensors  5/5   Toe extensors  5/5   Toe flexors  5/5   Toe flexors  5/5   Tone (Ashworth scale)  0  Tone (Ashworth scale)  0   MSRs:  Right                                                                 Left brachioradialis 2+  brachioradialis 2+  biceps 2+  biceps 2+  triceps 2+  triceps 2+  patellar 2+  patellar 2+  ankle jerk 2+  ankle jerk 2+  Hoffman no  Hoffman no  plantar response down  plantar response down    SENSORY:  Normal and  symmetric perception of light touch, pinprick, vibration, and proprioception.  Romberg's sign absent.   COORDINATION/GAIT: Normal finger-to- nose-finger and heel-to-shin.  Intact rapid alternating movements bilaterally.  Able to rise from a chair without using arms.  Gait narrow based and stable. Tandem and stressed gait intact.    IMPRESSION: Ms. Dymek is a 33 year-old female presenting for evaluation of aphasia.  Her exam is notable for phonemic paraphasias, global paraphasias, and semantic paraphasia.  Comprehension and naming is mildly impaired.  Repetition is intact.  The remainder of her neurological examination is normal and non-focal. MRI of the brain was personally reviewed and does not show any abnormalities over the posterior temporal gyrus.  I would like to obtain and EEG to look for any slowing or abnormal patterns and obtain formal neuropsychiatric testing.  Although there has been no recent increase in the dose of her zonisamide, I am concerned that her word-finding difficulty may be a medication effect, especially if work-up is unrevealing.  Going forward, I may recommend tapering her dose to see how her symptoms evolve.   PLAN/RECOMMENDATIONS:  1.  Formal neuropsychiatric testing 2.  Routine EEG 3.  Check vitamin B12, vitamin E 4.  Return to clinic in 6-weeks  The duration of this appointment visit was 60 minutes of face-to-face time with the patient.  Greater than 50% of this time was spent in  counseling, explanation of diagnosis, planning of further management, and coordination of care.   Thank you for allowing me to participate in patient's care.  If I can answer any additional questions, I would be pleased to do so.    Sincerely,    Donika K. Posey Pronto, DO

## 2013-12-10 LAB — VITAMIN B12: VITAMIN B 12: 386 pg/mL (ref 211–911)

## 2013-12-12 ENCOUNTER — Telehealth: Payer: Self-pay | Admitting: Neurology

## 2013-12-12 NOTE — Telephone Encounter (Signed)
EEG set up for 12-22-13 at 9:30 am at [facility]. Patient made aware.

## 2013-12-14 LAB — VITAMIN E
Gamma-Tocopherol (Vit E): 1.5 mg/L (ref <=4.3)
Vitamin E (Alpha Tocopherol): 14.1 mg/L (ref 5.7–19.9)

## 2013-12-15 ENCOUNTER — Encounter: Payer: Self-pay | Admitting: Neurology

## 2013-12-22 ENCOUNTER — Ambulatory Visit (HOSPITAL_COMMUNITY)
Admission: RE | Admit: 2013-12-22 | Discharge: 2013-12-22 | Disposition: A | Discharge status: Home / Self Care | Payer: Managed Care, Other (non HMO) | Source: Ambulatory Visit | Attending: Neurology | Admitting: Neurology

## 2013-12-22 DIAGNOSIS — R4701 Aphasia: Secondary | ICD-10-CM | POA: Insufficient documentation

## 2013-12-22 NOTE — Progress Notes (Signed)
EEG Completed; Results Pending  

## 2013-12-23 NOTE — Procedures (Signed)
ELECTROENCEPHALOGRAM REPORT  Date of Study: December 22, 2013  Patient's Name: Summer Anderson MRN: [id number]  Referring Provider: Noberto Retortonika K. Allena KatzPatel, DO  Indication: Spells of word-finding difficulty  Medications: Current Outpatient Prescriptions on File Prior to Encounter  Medication Sig Dispense Refill  . ACZONE 5 % topical gel Apply at bedtime      . ALPRAZolam (XANAX) 0.25 MG tablet Take 1 tablet (0.25 mg total) by mouth 3 (three) times daily as needed for sleep.  30 tablet  5  . Ascorbic Acid (VITAMIN C) 1000 MG tablet Take 2,000 mg by mouth daily.      . Calcium Carb-Cholecalciferol (CALCIUM 600/VITAMIN D3) 600-800 MG-UNIT TABS Take by mouth 2 (two) times daily.      Marland Kitchen. escitalopram (LEXAPRO) 10 MG tablet Take 10 mg by mouth daily.      Marland Kitchen. levonorgestrel (MIRENA) 20 MCG/24HR IUD 1 Intra Uterine Device (1 each total) by Intrauterine route once.  1 each  0  . Magnesium 250 MG TABS Take by mouth 2 (two) times daily.      . Multiple Vitamins-Minerals (HAIR/SKIN/NAILS PO) Take 1 tablet by mouth daily.      Marland Kitchen. zonisamide (ZONEGRAN) 100 MG capsule Take 300 mg by mouth daily.        No current facility-administered medications on file prior to encounter.     Technical Summary:   A 24 channel referential and bipolar montage EEG using the standard international 10-20 system was performed on the patient.    Description: The dominant background activity consists of 10 hertz activity seen most prominantly over the posterior head region.  The backgound activity is reactive to eye opening and closing procedures.  Low voltage fast (beta) activity is distributed symmetrically and maximally over the frontal regions.    Activation:  Hyperventilation and photic stimulation was performed, and produced no abnormalities.  Epileptiform Activity:  There were no spikes, sharp waves or paroxysmal activity.  Sleep:  Stage I is seen.  Cardiac:  The EKG lead revealed a normal sinus rhythm.  Impression: This  is a normal awake routine EEG.  There were no focal, hemispheric or lateralizing features.  No epileptiform activity was recorded.   Sincerity Cedar K. Allena KatzPatel, DO

## 2013-12-25 HISTORY — PX: BREAST ENHANCEMENT SURGERY: SHX7

## 2014-01-09 ENCOUNTER — Other Ambulatory Visit: Payer: Self-pay | Admitting: Internal Medicine

## 2014-01-11 NOTE — Telephone Encounter (Signed)
Faxed script back to [facility].../lmb 

## 2014-01-17 ENCOUNTER — Ambulatory Visit: Payer: Managed Care, Other (non HMO) | Admitting: Neurology

## 2014-03-27 ENCOUNTER — Other Ambulatory Visit: Payer: Self-pay | Admitting: Internal Medicine

## 2014-05-29 ENCOUNTER — Ambulatory Visit (INDEPENDENT_AMBULATORY_CARE_PROVIDER_SITE_OTHER): Payer: Managed Care, Other (non HMO) | Admitting: Family Medicine

## 2014-05-29 ENCOUNTER — Encounter: Payer: Self-pay | Admitting: Family Medicine

## 2014-05-29 VITALS — BP 112/74 | HR 89 | Temp 98.9°F | Ht 64.0 in | Wt 139.0 lb

## 2014-05-29 DIAGNOSIS — J329 Chronic sinusitis, unspecified: Secondary | ICD-10-CM

## 2014-05-29 DIAGNOSIS — J310 Chronic rhinitis: Secondary | ICD-10-CM

## 2014-05-29 DIAGNOSIS — H109 Unspecified conjunctivitis: Secondary | ICD-10-CM

## 2014-05-29 NOTE — Progress Notes (Signed)
Pre visit review using our clinic review tool, if applicable. No additional management support is needed unless otherwise documented below in the visit note. 

## 2014-05-29 NOTE — Progress Notes (Signed)
No chief complaint on file.   HPI:  Acute visit for:  "Pink Eye: -reports: started this morning - red and itchy eyes, pnd, nasal congestion - better now with OTC treatments -denies: fevers, pain, vision changes or exposure to pink eye  ROS: See pertinent positives and negatives per HPI.  Past Medical History  Diagnosis Date  . Depression   . Migraines   . Kidney stones   . Allergy   . Chronic headaches     Past Surgical History  Procedure Laterality Date  . No past surgeries      Family History  Problem Relation Age of Onset  . Acute myelogenous leukemia Father [id number]  . Parkinson's disease Maternal Grandfather     uncetain dx  . Healthy Mother   . Healthy Brother   . Healthy Sister   . Healthy Daughter     History   Social History  . Marital Status: Married    Spouse Name: N/A    Number of Children: N/A  . Years of Education: N/A   Social History Main Topics  . Smoking status: Never Smoker   . 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 Alcohol Use: Yes     Comment: Occasioanally  . Drug Use: No  . Sexual Activity: None   Other Topics Concern  . 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   She lives with husband and two daughters.           Current outpatient prescriptions:ALPRAZolam (XANAX) 0.25 MG tablet, TAKE 1 TABLET BY MOUTH THREE TIMES DAILY AS NEEDED, Disp: 30 tablet, Rfl: 0;  Ascorbic Acid (VITAMIN C) 1000 MG tablet, Take 2,000 mg by mouth daily., Disp: , Rfl: ;  Calcium Carb-Cholecalciferol (CALCIUM 600/VITAMIN D3) 600-800 MG-UNIT TABS, Take by mouth 2 (two) times daily., Disp: , Rfl: ;  escitalopram (LEXAPRO) 10 MG tablet, Take 10 mg by mouth daily., Disp: , Rfl:  levonorgestrel (MIRENA) 20 MCG/24HR IUD, 1 Intra Uterine Device (1 each total) by Intrauterine route once., Disp: 1 each, Rfl: 0;  loratadine (CLARITIN) 10 MG tablet, Take  10 mg by mouth daily., Disp: , Rfl: ;  Magnesium 250 MG TABS, Take by mouth 2 (two) times daily., Disp: , Rfl: ;  Multiple Vitamins-Minerals (HAIR/SKIN/NAILS PO), Take 1 tablet by mouth daily., Disp: , Rfl:   EXAM:  Filed Vitals:   05/29/14 1408  BP: 112/74  Pulse: 89  Temp: 98.9 F (37.2 C)    Body mass index is 23.85 kg/(m^2).  GENERAL: vitals reviewed and listed above, alert, oriented, appears well hydrated and in no acute distress  HEENT: atraumatic, conjunttiva clear, PERRLA, visual acuity grossly intact, no obvious abnormalities on inspection of external nose and ears, normal appearance of ear canals and TMs, clear nasal congestion, mild post oropharyngeal erythema with PND, no tonsillar edema or exudate, no sinus TTP  NECK: no obvious masses on inspection  LUNGS: clear to auscultation bilaterally, no wheezes, rales or rhonchi, good air movement  CV: HRRR, no peripheral edema  MS: moves all extremities without noticeable abnormality  PSYCH: pleasant and cooperative, no obvious depression or anxiety  ASSESSMENT AND PLAN:  Discussed the following assessment and plan:  Rhinosinusitis  Conjunctivitis unspecified   -likely viral or allergic, normal exam, no alarm features - antihistamine oral and compresses, return precuations -Patient advised to return or  notify a doctor immediately if symptoms worsen or persist or new concerns arise.  There are no Patient Instructions on file for this visit.   Colin Benton R.

## 2014-06-09 ENCOUNTER — Telehealth: Payer: Self-pay | Admitting: Internal Medicine

## 2014-06-09 NOTE — Telephone Encounter (Signed)
Rec'd from [facility] forward 2 pages to Dr. Leschber °

## 2014-06-19 ENCOUNTER — Other Ambulatory Visit: Payer: Self-pay | Admitting: Obstetrics and Gynecology

## 2014-06-20 LAB — CYTOLOGY - PAP

## 2014-06-23 ENCOUNTER — Telehealth: Payer: Self-pay | Admitting: Internal Medicine

## 2014-06-23 NOTE — Telephone Encounter (Signed)
Rec'd from [facility] forward 1 page to [employer]ann[employer] CoLechber

## 2014-07-31 ENCOUNTER — Other Ambulatory Visit: Payer: Self-pay | Admitting: Internal Medicine

## 2014-08-01 ENCOUNTER — Other Ambulatory Visit: Payer: Self-pay

## 2014-08-01 MED ORDER — ALPRAZOLAM 0.25 MG PO TABS: ORAL_TABLET | ORAL | Status: DC

## 2014-08-01 NOTE — Telephone Encounter (Signed)
Refill has already been completed.

## 2014-09-21 ENCOUNTER — Ambulatory Visit (INDEPENDENT_AMBULATORY_CARE_PROVIDER_SITE_OTHER): Payer: Managed Care, Other (non HMO) | Admitting: Internal Medicine

## 2014-09-21 ENCOUNTER — Encounter: Payer: Self-pay | Admitting: Internal Medicine

## 2014-09-21 ENCOUNTER — Other Ambulatory Visit (INDEPENDENT_AMBULATORY_CARE_PROVIDER_SITE_OTHER): Payer: Managed Care, Other (non HMO)

## 2014-09-21 VITALS — BP 114/74 | HR 97 | Temp 98.5°F | Resp 14 | Ht 64.0 in | Wt 144.8 lb

## 2014-09-21 DIAGNOSIS — R635 Abnormal weight gain: Secondary | ICD-10-CM

## 2014-09-21 LAB — BASIC METABOLIC PANEL WITH GFR
BUN: 16 mg/dL (ref 6–23)
CO2: 26 meq/L (ref 19–32)
Calcium: 8.9 mg/dL (ref 8.4–10.5)
Chloride: 107 meq/L (ref 96–112)
Creatinine, Ser: 0.8 mg/dL (ref 0.4–1.2)
GFR: 93.96 mL/min (ref >60.00)
Glucose, Bld: 80 mg/dL (ref 70–99)
Potassium: 4 meq/L (ref 3.5–5.1)
Sodium: 137 meq/L (ref 135–145)

## 2014-09-21 LAB — T4, FREE: FREE T4: 0.81 ng/dL (ref 0.60–1.60)

## 2014-09-21 LAB — TSH: TSH: 0.66 u[IU]/mL (ref 0.35–4.50)

## 2014-09-21 NOTE — Patient Instructions (Signed)
We will check on the thyroid and the actual thyroid levels. If these come back normal it may be worthwhile to have your gynecologist check on the hormone levels to see if these have any abnormalities.  We will call you with the results.

## 2014-09-21 NOTE — Progress Notes (Signed)
Pre visit review using our clinic review tool, if applicable. No additional management support is needed unless otherwise documented below in the visit note. 

## 2014-09-22 DIAGNOSIS — R635 Abnormal weight gain: Secondary | ICD-10-CM | POA: Insufficient documentation

## 2014-09-22 HISTORY — DX: Abnormal weight gain: R63.5

## 2014-09-22 NOTE — Assessment & Plan Note (Signed)
Feel that patient is at a good healthy weight however she wishes to make sure nothing is wrong. Will check TSH and free T4 since we do not have records of most recent TSH. Also check BMP, recent CBC normal and no bleeding problems.

## 2014-09-22 NOTE — Progress Notes (Signed)
   Subjective:    Patient ID: Summer Anderson, female    DOB: January 05, 1981, 33 y.o.   MRN: [id number]  HPI The patient is a 33 YO female coming in for an acute visti for weight gain. She has still been eating the same and exercising 5 times per week but has gained about 20 pounds in last 6 months. She has had thyroid checked but wants to find out what is causing it. She denies fevers, joint pains, pain. Denies hair thinning, diarrhea, constipation. Is feeling a little more tired than usual.  Review of Systems  Constitutional: Positive for fatigue and unexpected weight change. Negative for fever, activity change and appetite change.  HENT: Negative.   Eyes: Negative.   Respiratory: Negative for cough, chest tightness, shortness of breath and wheezing.   Cardiovascular: Negative for chest pain, palpitations and leg swelling.  Gastrointestinal: Negative for abdominal pain, diarrhea, constipation and abdominal distention.  Endocrine: Positive for cold intolerance. Negative for heat intolerance, polydipsia, polyphagia and polyuria.  Genitourinary: Negative.   Musculoskeletal: Negative.   Skin: Negative.   Neurological: Negative for dizziness, weakness, light-headedness and headaches.  Psychiatric/Behavioral: Negative.       Objective:   Physical Exam  Vitals reviewed. Constitutional: She is oriented to person, place, and time. She appears well-developed and well-nourished.  HENT:  Head: Normocephalic and atraumatic.  Eyes: EOM are normal.  Neck: Normal range of motion.  Cardiovascular: Normal rate and regular rhythm.   Pulmonary/Chest: Effort normal and breath sounds normal. No respiratory distress. She has no wheezes. She has no rales.  Abdominal: Soft. Bowel sounds are normal. She exhibits no distension. There is no tenderness. There is no rebound.  Musculoskeletal: She exhibits no edema and no tenderness.  Neurological: She is alert and oriented to person, place, and time. Coordination  normal.  Skin: Skin is warm and dry.   Filed Vitals:   09/21/14 1006  BP: 114/74  Pulse: 97  Temp: 98.5 F (36.9 C)  TempSrc: Oral  Resp: 14  Height: 5\' 4"  (1.626 m)  Weight: 144 lb 12.8 oz (65.681 kg)  SpO2: 96%      Assessment & Plan:

## 2014-09-26 ENCOUNTER — Telehealth: Payer: Self-pay | Admitting: *Deleted

## 2014-09-26 NOTE — Telephone Encounter (Signed)
Left msg on triage requesting lab results.../lmb 

## 2014-09-27 NOTE — Telephone Encounter (Signed)
Called pt inform her labs has been release to mychart- all labs normal.../lmb

## 2015-02-27 ENCOUNTER — Other Ambulatory Visit: Payer: Self-pay | Admitting: Internal Medicine

## 2015-06-11 ENCOUNTER — Encounter: Payer: Self-pay | Admitting: Internal Medicine

## 2015-06-11 ENCOUNTER — Ambulatory Visit (INDEPENDENT_AMBULATORY_CARE_PROVIDER_SITE_OTHER): Payer: Managed Care, Other (non HMO) | Admitting: Internal Medicine

## 2015-06-11 ENCOUNTER — Other Ambulatory Visit (INDEPENDENT_AMBULATORY_CARE_PROVIDER_SITE_OTHER): Payer: Managed Care, Other (non HMO)

## 2015-06-11 VITALS — BP 108/68 | HR 86 | Temp 98.2°F | Ht 64.0 in | Wt 133.2 lb

## 2015-06-11 DIAGNOSIS — Z23 Encounter for immunization: Secondary | ICD-10-CM | POA: Diagnosis not present

## 2015-06-11 DIAGNOSIS — Z0000 Encounter for general adult medical examination without abnormal findings: Secondary | ICD-10-CM

## 2015-06-11 DIAGNOSIS — Z114 Encounter for screening for human immunodeficiency virus [HIV]: Secondary | ICD-10-CM

## 2015-06-11 DIAGNOSIS — M222X1 Patellofemoral disorders, right knee: Secondary | ICD-10-CM

## 2015-06-11 DIAGNOSIS — M222X9 Patellofemoral disorders, unspecified knee: Secondary | ICD-10-CM

## 2015-06-11 HISTORY — DX: Patellofemoral disorders, unspecified knee: M22.2X9

## 2015-06-11 LAB — CBC WITH DIFFERENTIAL/PLATELET
Basophils Absolute: 0 10*3/uL (ref 0.0–0.1)
Basophils Relative: 0.5 % (ref 0.0–3.0)
EOS PCT: 0.6 % (ref 0.0–5.0)
Eosinophils Absolute: 0 10*3/uL (ref 0.0–0.7)
HCT: 38 % (ref 36.0–46.0)
Hemoglobin: 13 g/dL (ref 12.0–15.0)
LYMPHS PCT: 32.6 % (ref 12.0–46.0)
Lymphs Abs: 1.3 10*3/uL (ref 0.7–4.0)
MCHC: 34.3 g/dL (ref 30.0–36.0)
MCV: 96.5 fl (ref 78.0–100.0)
Monocytes Absolute: 0.4 10*3/uL (ref 0.1–1.0)
Monocytes Relative: 10.1 % (ref 3.0–12.0)
Neutro Abs: 2.3 10*3/uL (ref 1.4–7.7)
Neutrophils Relative %: 56.2 % (ref 43.0–77.0)
Platelets: 206 10*3/uL (ref 150.0–400.0)
RBC: 3.94 Mil/uL (ref 3.87–5.11)
RDW: 12 % (ref 11.5–15.5)
WBC: 4.1 10*3/uL (ref 4.0–10.5)

## 2015-06-11 LAB — LIPID PANEL
Cholesterol: 173 mg/dL (ref 0–200)
HDL: 39.1 mg/dL (ref >39.00)
LDL CALC: 102 mg/dL — AB (ref 0–99)
NonHDL: 133.9
Total CHOL/HDL Ratio: 4
Triglycerides: 161 mg/dL — ABNORMAL HIGH (ref 0.0–149.0)
VLDL: 32.2 mg/dL (ref 0.0–40.0)

## 2015-06-11 LAB — URINALYSIS, ROUTINE W REFLEX MICROSCOPIC
Bilirubin Urine: NEGATIVE
HGB URINE DIPSTICK: NEGATIVE
Ketones, ur: NEGATIVE
Leukocytes, UA: NEGATIVE
Nitrite: NEGATIVE
SPECIFIC GRAVITY, URINE: 1.025 (ref 1.000–1.030)
Total Protein, Urine: NEGATIVE
UROBILINOGEN UA: 0.2 (ref 0.0–1.0)
Urine Glucose: NEGATIVE
pH: 6 (ref 5.0–8.0)

## 2015-06-11 LAB — BASIC METABOLIC PANEL
BUN: 12 mg/dL (ref 6–23)
CALCIUM: 9.4 mg/dL (ref 8.4–10.5)
CHLORIDE: 109 meq/L (ref 96–112)
CO2: 26 mEq/L (ref 19–32)
Creatinine, Ser: 0.73 mg/dL (ref 0.40–1.20)
GFR: 96.52 mL/min (ref >60.00)
GLUCOSE: 89 mg/dL (ref 70–99)
Potassium: 4.5 mEq/L (ref 3.5–5.1)
Sodium: 142 mEq/L (ref 135–145)

## 2015-06-11 LAB — HEPATIC FUNCTION PANEL
ALBUMIN: 4.4 g/dL (ref 3.5–5.2)
ALK PHOS: 42 U/L (ref 39–117)
ALT: 10 U/L (ref 0–35)
AST: 12 U/L (ref 0–37)
BILIRUBIN DIRECT: 0.1 mg/dL (ref 0.0–0.3)
BILIRUBIN TOTAL: 0.5 mg/dL (ref 0.2–1.2)
TOTAL PROTEIN: 6.9 g/dL (ref 6.0–8.3)

## 2015-06-11 LAB — TSH: TSH: 0.72 u[IU]/mL (ref 0.35–4.50)

## 2015-06-11 LAB — HIV ANTIBODY (ROUTINE TESTING W REFLEX): HIV 1&2 Ab, 4th Generation: NONREACTIVE

## 2015-06-11 NOTE — Progress Notes (Signed)
Pre visit review using our clinic review tool, if applicable. No additional management support is needed unless otherwise documented below in the visit note. 

## 2015-06-11 NOTE — Progress Notes (Signed)
Subjective:    Patient ID: Summer Anderson,Summer Anderson female    DOB: 08/19/1981, 34 y.o.   MRN: [id number]  HPI  Patient here for annual physical.  Also reviewed chronic ankle conditions, interval events and current concerns  Past Medical History  Diagnosis Date  . Depression     hx of, no medications since 12/15  . Migraines   . Kidney stones 2004  . Allergy   . Chronic headaches    Family History  Problem Relation Age of Onset  . Acute myelogenous leukemia Father 258  . Parkinson's disease Maternal Grandfather     uncetain dx  . Healthy Mother   . Healthy Brother   . Healthy Sister   . Healthy Daughter    History  Substance Use Topics  . Smoking status: Never Smoker   . Smokeless tobacco: Not on file  . Alcohol Use: 0.0 oz/week    0 Standard drinks or equivalent per week     Comment: Occasioanally    Review of Systems  Constitutional: Negative for fatigue and unexpected weight change.  Respiratory: Negative for cough, shortness of breath and wheezing.   Cardiovascular: Negative for chest pain, palpitations and leg swelling.  Gastrointestinal: Negative for nausea, abdominal pain and diarrhea.  Musculoskeletal: Positive for arthralgias (front of R knee w/ pain, near daily - worse with activity, especially running and climbling/descending).  Neurological: Negative for dizziness, weakness, light-headedness and headaches.       All speech problems resolved with DC of Lexapro  Psychiatric/Behavioral: Negative for suicidal ideas, self-injury, dysphoric mood and decreased concentration. The patient is not nervous/anxious.   All other systems reviewed and are negative.      Objective:    Physical Exam  Constitutional: She is oriented to person, place, and time. She appears well-developed and well-nourished. No distress.  HENT:  Head: Normocephalic and atraumatic.  Right Ear: External ear normal.  Left Ear: External ear normal.  Nose: Nose normal.  Mouth/Throat: Oropharynx is  clear and moist. No oropharyngeal exudate.  Eyes: EOM are normal. Pupils are equal, round, and reactive to light. Right eye exhibits no discharge. Left eye exhibits no discharge. No scleral icterus.  Neck: Normal range of motion. Neck supple. No JVD present. No tracheal deviation present. No thyromegaly present.  Cardiovascular: Normal rate, regular rhythm, normal heart sounds and intact distal pulses.  Exam reveals no friction rub.   No murmur heard. Pulmonary/Chest: Effort normal and breath sounds normal. No respiratory distress. She has no wheezes. She has no rales. She exhibits no tenderness.  Abdominal: Soft. Bowel sounds are normal. She exhibits no distension and no mass. There is no tenderness. There is no rebound and no guarding.  Genitourinary:  Defer to gyn  Musculoskeletal: Normal range of motion.  No gross deformities  Lymphadenopathy:    She has no cervical adenopathy.  Neurological: She is alert and oriented to person, place, and time. She has normal reflexes. No cranial nerve deficit.  Skin: Skin is warm and dry. No rash noted. She is not diaphoretic. No erythema.  Psychiatric: She has a normal mood and affect. Her behavior is normal. Judgment and thought content normal.  Nursing note and vitals reviewed.   BP 108/68 mmHg  Pulse 86  Temp(Src) 98.2 F (36.8 C) (Oral)  Ht 5\' 4"  (1.626 m)  Wt 133 lb 4 oz (60.442 kg)  BMI 22.86 kg/m2  SpO2 98% Wt Readings from Last 3 Encounters:  06/11/15 133 lb 4 oz (60.442 kg)  09/21/14  144 lb 12.8 oz (65.681 kg)  05/29/14 139 lb (63.05 kg)    Lab Results  Component Value Date   WBC 6.2 07/07/2013   HGB 12.7 07/07/2013   HCT 37.6 07/07/2013   PLT 219.0 07/07/2013   GLUCOSE 80 09/21/2014   CHOL 173 07/07/2013   TRIG 87.0 07/07/2013   HDL 47.00 07/07/2013   LDLCALC 109* 07/07/2013   ALT 12 07/07/2013   AST 16 07/07/2013   NA 137 09/21/2014   K 4.0 09/21/2014   CL 107 09/21/2014   CREATININE 0.8 09/21/2014   BUN 16  09/21/2014   CO2 26 09/21/2014   TSH 0.66 09/21/2014    No results found.     Assessment & Plan:   CPX/z00.0 - Patient has been counseled on age-appropriate routine health concerns for screening and prevention. These are reviewed and up-to-date. Immunizations are up-to-date or declined. Labs ordered and reviewed.  Problem List Items Addressed This Visit    Patella-femoral syndrome    Persisting anterior knee pain consistent with diagnosis. No specific injury, exacerbated by climbing, and descending or running. Exam benign. Advised over-the-counter anti-inflammatory's, quad strengthening exercises and ice after activity. If persisting symptoms despite conservative care or if worsening with activity, patient advised to follow-up with sports medicine for further evaluation and treatment options. Patient education on syndrome provided       Other Visit Diagnoses    Routine general medical examination at a health care facility    -  Primary    Relevant Orders    Basic metabolic panel    CBC with Differential/Platelet    Hepatic function panel    Lipid panel    TSH    Urinalysis, Routine w reflex microscopic (not at [facility])    Screening for HIV without presence of risk factors        Relevant Orders    HIV antibody        Rene Paci, MD

## 2015-06-11 NOTE — Assessment & Plan Note (Signed)
Persisting anterior knee pain consistent with diagnosis. No specific injury, exacerbated by climbing, and descending or running. Exam benign. Advised over-the-counter anti-inflammatory's, quad strengthening exercises and ice after activity. If persisting symptoms despite conservative care or if worsening with activity, patient advised to follow-up with sports medicine for further evaluation and treatment options. Patient education on syndrome provided

## 2015-06-11 NOTE — Patient Instructions (Addendum)
It was good to see you today.  We have reviewed your prior records including labs and tests today  Health Maintenance reviewed - Tdap (tetanus diphtheria and pertussis) immunizations updated today, good for 10 years. All other recommended immunizations and age-appropriate screenings are up-to-date.  Test(s) ordered today. Your results will be released to [place] (or called to you) after review, usually within 72hours after test completion. If any changes need to be made, you will be notified at that same time.  Medications reviewed and updated, no changes recommended at this time.  Follow directions below for treatment of knee pain and please schedule appointment with Dr. Tamala Julian if persisting pain despite conservative care  Please schedule followup in 12 months for annual exam and labs, call sooner if problems.   Health Maintenance Adopting a healthy lifestyle and getting preventive care can go a long way to promote health and wellness. Talk with your health care provider about what schedule of regular examinations is right for you. This is a good chance for you to check in with your provider about disease prevention and staying healthy. In between checkups, there are plenty of things you can do on your own. Experts have done a lot of research about which lifestyle changes and preventive measures are most likely to keep you healthy. Ask your health care provider for more information. WEIGHT AND DIET  Eat a healthy diet  Be sure to include plenty of vegetables, fruits, low-fat dairy products, and lean protein.  Do not eat a lot of foods high in solid fats, added sugars, or salt.  Get regular exercise. This is one of the most important things you can do for your health.  Most adults should exercise for at least 150 minutes each week. The exercise should increase your heart rate and make you sweat (moderate-intensity exercise).  Most adults should also do strengthening exercises at least  twice a week. This is in addition to the moderate-intensity exercise.  Maintain a healthy weight  Body mass index (BMI) is a measurement that can be used to identify possible weight problems. It estimates body fat based on height and weight. Your health care provider can help determine your BMI and help you achieve or maintain a healthy weight.  For females 28 years of age and older:   A BMI below 18.5 is considered underweight.  A BMI of 18.5 to 24.9 is normal.  A BMI of 25 to 29.9 is considered overweight.  A BMI of 30 and above is considered obese.  Watch levels of cholesterol and blood lipids  You should start having your blood tested for lipids and cholesterol at 35 years of age, then have this test every 5 years.  You may need to have your cholesterol levels checked more often if:  Your lipid or cholesterol levels are high.  You are older than 35 years of age.  You are at high risk for heart disease.  CANCER SCREENING   Lung Cancer  Lung cancer screening is recommended for adults 59-2 years old who are at high risk for lung cancer because of a history of smoking.  A yearly low-dose CT scan of the lungs is recommended for people who:  Currently smoke.  Have quit within the past 15 years.  Have at least a 30-pack-year history of smoking. A pack year is smoking an average of one pack of cigarettes a day for 1 year.  Yearly screening should continue until it has been 15 years since you quit.  Yearly screening should stop if you develop a health problem that would prevent you from having lung cancer treatment.  Breast Cancer  Practice breast self-awareness. This means understanding how your breasts normally appear and feel.  It also means doing regular breast self-exams. Let your health care provider know about any changes, no matter how small.  If you are in your 20s or 30s, you should have a clinical breast exam (CBE) by a health care provider every 1-3 years  as part of a regular health exam.  If you are 56 or older, have a CBE every year. Also consider having a breast X-ray (mammogram) every year.  If you have a family history of breast cancer, talk to your health care provider about genetic screening.  If you are at high risk for breast cancer, talk to your health care provider about having an MRI and a mammogram every year.  Breast cancer gene (BRCA) assessment is recommended for women who have family members with BRCA-related cancers. BRCA-related cancers include:  Breast.  Ovarian.  Tubal.  Peritoneal cancers.  Results of the assessment will determine the need for genetic counseling and BRCA1 and BRCA2 testing. Cervical Cancer Routine pelvic examinations to screen for cervical cancer are no longer recommended for nonpregnant women who are considered low risk for cancer of the pelvic organs (ovaries, uterus, and vagina) and who do not have symptoms. A pelvic examination may be necessary if you have symptoms including those associated with pelvic infections. Ask your health care provider if a screening pelvic exam is right for you.   The Pap test is the screening test for cervical cancer for women who are considered at risk.  If you had a hysterectomy for a problem that was not cancer or a condition that could lead to cancer, then you no longer need Pap tests.  If you are older than 65 years, and you have had normal Pap tests for the past 10 years, you no longer need to have Pap tests.  If you have had past treatment for cervical cancer or a condition that could lead to cancer, you need Pap tests and screening for cancer for at least 20 years after your treatment.  If you no longer get a Pap test, assess your risk factors if they change (such as having a new sexual partner). This can affect whether you should start being screened again.  Some women have medical problems that increase their chance of getting cervical cancer. If this is  the case for you, your health care provider may recommend more frequent screening and Pap tests.  The human papillomavirus (HPV) test is another test that may be used for cervical cancer screening. The HPV test looks for the virus that can cause cell changes in the cervix. The cells collected during the Pap test can be tested for HPV.  The HPV test can be used to screen women 9 years of age and older. Getting tested for HPV can extend the interval between normal Pap tests from three to five years.  An HPV test also should be used to screen women of any age who have unclear Pap test results.  After 35 years of age, women should have HPV testing as often as Pap tests.  Colorectal Cancer  This type of cancer can be detected and often prevented.  Routine colorectal cancer screening usually begins at 35 years of age and continues through 35 years of age.  Your health care provider may recommend screening at  an earlier age if you have risk factors for colon cancer.  Your health care provider may also recommend using home test kits to check for hidden blood in the stool.  A small camera at the end of a tube can be used to examine your colon directly (sigmoidoscopy or colonoscopy). This is done to check for the earliest forms of colorectal cancer.  Routine screening usually begins at age 59.  Direct examination of the colon should be repeated every 5-10 years through 35 years of age. However, you may need to be screened more often if early forms of precancerous polyps or small growths are found. Skin Cancer  Check your skin from head to toe regularly.  Tell your health care provider about any new moles or changes in moles, especially if there is a change in a mole's shape or color.  Also tell your health care provider if you have a mole that is larger than the size of a pencil eraser.  Always use sunscreen. Apply sunscreen liberally and repeatedly throughout the day.  Protect yourself by  wearing long sleeves, pants, a wide-brimmed hat, and sunglasses whenever you are outside. HEART DISEASE, DIABETES, AND HIGH BLOOD PRESSURE   Have your blood pressure checked at least every 1-2 years. High blood pressure causes heart disease and increases the risk of stroke.  If you are between 64 years and 13 years old, ask your health care provider if you should take aspirin to prevent strokes.  Have regular diabetes screenings. This involves taking a blood sample to check your fasting blood sugar level.  If you are at a normal weight and have a low risk for diabetes, have this test once every three years after 35 years of age.  If you are overweight and have a high risk for diabetes, consider being tested at a younger age or more often. PREVENTING INFECTION  Hepatitis B  If you have a higher risk for hepatitis B, you should be screened for this virus. You are considered at high risk for hepatitis B if:  You were born in a country where hepatitis B is common. Ask your health care provider which countries are considered high risk.  Your parents were born in a high-risk country, and you have not been immunized against hepatitis B (hepatitis B vaccine).  You have HIV or AIDS.  You use needles to inject street drugs.  You live with someone who has hepatitis B.  You have had sex with someone who has hepatitis B.  You get hemodialysis treatment.  You take certain medicines for conditions, including cancer, organ transplantation, and autoimmune conditions. Hepatitis C  Blood testing is recommended for:  Everyone born from [birth year] through 1966.  Anyone with known risk factors for hepatitis C. Sexually transmitted infections (STIs)  You should be screened for sexually transmitted infections (STIs) including gonorrhea and chlamydia if:  You are sexually active and are younger than 35 years of age.  You are older than 35 years of age and your health care provider tells you that you  are at risk for this type of infection.  Your sexual activity has changed since you were last screened and you are at an increased risk for chlamydia or gonorrhea. Ask your health care provider if you are at risk.  If you do not have HIV, but are at risk, it may be recommended that you take a prescription medicine daily to prevent HIV infection. This is called pre-exposure prophylaxis (PrEP). You are considered at  risk if:  You are sexually active and do not regularly use condoms or know the HIV status of your partner(s).  You take drugs by injection.  You are sexually active with a partner who has HIV. Talk with your health care provider about whether you are at high risk of being infected with HIV. If you choose to begin PrEP, you should first be tested for HIV. You should then be tested every 3 months for as long as you are taking PrEP.  PREGNANCY   If you are premenopausal and you may become pregnant, ask your health care provider about preconception counseling.  If you may become pregnant, take 400 to 800 micrograms (mcg) of folic acid every day.  If you want to prevent pregnancy, talk to your health care provider about birth control (contraception). OSTEOPOROSIS AND MENOPAUSE   Osteoporosis is a disease in which the bones lose minerals and strength with aging. This can result in serious bone fractures. Your risk for osteoporosis can be identified using a bone density scan.  If you are 13 years of age or older, or if you are at risk for osteoporosis and fractures, ask your health care provider if you should be screened.  Ask your health care provider whether you should take a calcium or vitamin D supplement to lower your risk for osteoporosis.  Menopause may have certain physical symptoms and risks.  Hormone replacement therapy may reduce some of these symptoms and risks. Talk to your health care provider about whether hormone replacement therapy is right for you.  HOME CARE  INSTRUCTIONS   Schedule regular health, dental, and eye exams.  Stay current with your immunizations.   Do not use any tobacco products including cigarettes, chewing tobacco, or electronic cigarettes.  If you are pregnant, do not drink alcohol.  If you are breastfeeding, limit how much and how often you drink alcohol.  Limit alcohol intake to no more than 1 drink per day for nonpregnant women. One drink equals 12 ounces of beer, 5 ounces of wine, or 1 ounces of hard liquor.  Do not use street drugs.  Do not share needles.  Ask your health care provider for help if you need support or information about quitting drugs.  Tell your health care provider if you often feel depressed.  Tell your health care provider if you have ever been abused or do not feel safe at home. Document Released: 05/26/2011 Document Revised: 03/27/2014 Document Reviewed: 10/12/2013 [facility] Patient Information 2015 [place], [place]. This information is not intended to replace advice given to you by your health care provider. Make sure you discuss any questions you have with your health care provider. Patellofemoral Syndrome If you have had pain in the front of your knee for a long time, chances are good that you have patellofemoral syndrome. The word patella refers to the kneecap. Femoral (or femur) refers to the thigh bone. That is the bone the kneecap sits on. The kneecap is shaped like a triangle. Its job is to protect the knee and to improve the efficiency of your thigh muscles (quadriceps). The underside of the kneecap is made of smooth tissue (cartilage). This lets the kneecap slide up and down as the knee moves. Sometimes this cartilage becomes soft. Your healthcare provider may say the cartilage breaks down. That is patellofemoral syndrome. It can affect one knee, or both. The condition is sometimes called patellofemoral pain syndrome. That is because the condition is painful. The pain usually gets worse with  activity. Sitting for a long time with the knee bent also makes the pain worse. It usually gets better with rest and proper treatment. CAUSES  No one is sure why some people develop this problem and others do not. Runners often get it. One name for the condition is "runner's knee." However, some people run for years and never have knee pain. Certain things seem to make patellofemoral syndrome more likely. They include:  Moving out of alignment. The kneecap is supposed to move in a straight line when the thigh muscle pulls on it. Sometimes the kneecap moves in poor alignment. That can make the knee swell and hurt. Some experts believe it also wears down the cartilage.  Injury to the kneecap.  Strain on the knee. This may occur during sports activity. Soccer, running, skiing and cycling can put excess stress on the knee.  Being flat-footed or knock-kneed. SYMPTOMS   Knee pain.  Pain under the kneecap. This is usually a dull, aching pain.  Pain in the knee when doing certain things: squatting, kneeling, going up or down stairs.  Pain in the knee when you stand up after sitting down for awhile.  Tightness in the knee.  Loss of muscle strength in the thigh.  Swelling of the knee. DIAGNOSIS  Healthcare providers often send people with knee pain to an orthopedic caregiver. This person has special training to treat problems with bones and joints. To decide what is causing your knee pain, your caregiver will probably:  Do a physical exam. This will probably include:  Asking about symptoms you have noticed.  Asking about your activities and any injuries.  Feeling your knee. Moving it. This will help test the knee's strength. It will also check alignment (whether the knee and leg are aligned normally).  Order some tests, such as:  Imaging tests. They create pictures of the inside of the knee. Tests may include:  X-rays.  Computed tomography (CT) scan. This uses X-rays and a computer  to show more detail.  Magnetic resonance imaging (MRI). This test uses magnets, radio waves and a computer to make pictures. TREATMENT   Medication is almost always used first. It can relieve pain. It also can reduce swelling. Non-steroidal anti-inflammatory medicines (called NSAIDs) are usually suggested. Sometimes a stronger form is needed. A stronger form would require a prescription.  Other treatment may be needed after the swelling goes down. Possibilities include:  Exercise. Certain exercises can make the muscles around the knee stronger which decreases the pressure on the knee cap. This includes the thigh muscle. Certain exercises also may be suggested to increase your flexibility.  A knee brace. This gives the knee extra support and helps align the movement of the knee cap.  Orthotics. These are special shoe inserts. They can help keep your leg and knee aligned.  Surgery is sometimes needed. This is rare. Options include:  Arthroscopy. The surgeon uses a special tool to remove any damaged pieces of the kneecap. Only a few small incisions (cuts) are needed.  Realignment. This is open surgery. The goals are to reduce pressure and fix the way the kneecap moves. HOME CARE INSTRUCTIONS   Take any medication prescribed by your healthcare provider. Follow the directions carefully.  If your knee is swollen:  Put ice or cold packs on it. Do this for 20 to 30 minutes, 3 to 4 times a day.  Keep the knee raised. Make sure it is supported. Put a pillow under it.  Rest your knee.  For example, take the elevator instead of the stairs for awhile. Or, take a break from sports activity that strain your knee. Try walking or swimming instead.  Whenever you are active:  Use an elastic bandage on your knee. This gives it support.  After any activity, put ice or cold packs on your knees. Do this for about 10 to 20 minutes.  Make sure you wear shoes that give good support. Make sure they are not  worn down. The heels should not slant in or out. SEEK MEDICAL CARE IF:   Knee pain gets worse. Or it does not go away, even after taking pain medicine.  Swelling does not go down.  Your thigh muscle becomes weak.  You have an oral temperature above 102 F (38.9 C). SEEK IMMEDIATE MEDICAL CARE IF:  You have an oral temperature above 102 F (38.9 C), not controlled by medicine. Document Released: 10/29/2009 Document Revised: 02/02/2012 Document Reviewed: 01/30/2014 [facility] Patient Information 2015 [place], [place]. This information is not intended to replace advice given to you by your health care provider. Make sure you discuss any questions you have with your health care provider.

## 2015-07-10 ENCOUNTER — Other Ambulatory Visit: Payer: Self-pay | Admitting: Internal Medicine

## 2015-11-13 ENCOUNTER — Other Ambulatory Visit: Payer: Self-pay | Admitting: Obstetrics and Gynecology

## 2015-11-13 DIAGNOSIS — N6310 Unspecified lump in the right breast, unspecified quadrant: Secondary | ICD-10-CM

## 2015-11-15 ENCOUNTER — Ambulatory Visit
Admission: RE | Admit: 2015-11-15 | Discharge: 2015-11-15 | Disposition: A | Discharge status: Home / Self Care | Payer: 59 | Source: Ambulatory Visit | Attending: Obstetrics and Gynecology | Admitting: Obstetrics and Gynecology

## 2015-11-15 ENCOUNTER — Other Ambulatory Visit: Payer: Self-pay | Admitting: Obstetrics and Gynecology

## 2015-11-15 ENCOUNTER — Ambulatory Visit
Admission: RE | Admit: 2015-11-15 | Discharge: 2015-11-15 | Disposition: A | Discharge status: Home / Self Care | Payer: Managed Care, Other (non HMO) | Source: Ambulatory Visit | Attending: Obstetrics and Gynecology | Admitting: Obstetrics and Gynecology

## 2015-11-15 DIAGNOSIS — N6310 Unspecified lump in the right breast, unspecified quadrant: Secondary | ICD-10-CM

## 2015-11-16 ENCOUNTER — Other Ambulatory Visit: Payer: Managed Care, Other (non HMO)

## 2016-04-08 ENCOUNTER — Other Ambulatory Visit: Payer: Self-pay | Admitting: Obstetrics and Gynecology

## 2016-04-08 DIAGNOSIS — N6011 Diffuse cystic mastopathy of right breast: Secondary | ICD-10-CM

## 2016-05-16 ENCOUNTER — Other Ambulatory Visit: Payer: 59

## 2016-05-26 ENCOUNTER — Other Ambulatory Visit: Payer: 59

## 2016-06-13 ENCOUNTER — Other Ambulatory Visit (INDEPENDENT_AMBULATORY_CARE_PROVIDER_SITE_OTHER): Payer: 59

## 2016-06-13 ENCOUNTER — Encounter: Payer: Self-pay | Admitting: Internal Medicine

## 2016-06-13 ENCOUNTER — Ambulatory Visit (INDEPENDENT_AMBULATORY_CARE_PROVIDER_SITE_OTHER): Payer: 59 | Admitting: Internal Medicine

## 2016-06-13 VITALS — BP 112/74 | HR 74 | Temp 98.5°F | Resp 16 | Ht 64.0 in | Wt 130.0 lb

## 2016-06-13 DIAGNOSIS — Z0000 Encounter for general adult medical examination without abnormal findings: Secondary | ICD-10-CM | POA: Diagnosis not present

## 2016-06-13 DIAGNOSIS — R897 Abnormal histological findings in specimens from other organs, systems and tissues: Secondary | ICD-10-CM | POA: Insufficient documentation

## 2016-06-13 DIAGNOSIS — F419 Anxiety disorder, unspecified: Secondary | ICD-10-CM | POA: Diagnosis not present

## 2016-06-13 DIAGNOSIS — G43109 Migraine with aura, not intractable, without status migrainosus: Secondary | ICD-10-CM | POA: Diagnosis not present

## 2016-06-13 HISTORY — DX: Abnormal histological findings in specimens from other organs, systems and tissues: R89.7

## 2016-06-13 HISTORY — DX: Anxiety disorder, unspecified: F41.9

## 2016-06-13 LAB — CBC WITH DIFFERENTIAL/PLATELET
BASOS ABS: 0 10*3/uL (ref 0.0–0.1)
BASOS PCT: 0.3 % (ref 0.0–3.0)
EOS ABS: 0 10*3/uL (ref 0.0–0.7)
Eosinophils Relative: 0.7 % (ref 0.0–5.0)
HCT: 37.8 % (ref 36.0–46.0)
Hemoglobin: 12.9 g/dL (ref 12.0–15.0)
LYMPHS ABS: 1.8 10*3/uL (ref 0.7–4.0)
LYMPHS PCT: 30.1 % (ref 12.0–46.0)
MCHC: 34 g/dL (ref 30.0–36.0)
MCV: 95.2 fl (ref 78.0–100.0)
MONO ABS: 0.5 10*3/uL (ref 0.1–1.0)
Monocytes Relative: 7.8 % (ref 3.0–12.0)
NEUTROS ABS: 3.7 10*3/uL (ref 1.4–7.7)
NEUTROS PCT: 61.1 % (ref 43.0–77.0)
PLATELETS: 209 10*3/uL (ref 150.0–400.0)
RBC: 3.96 Mil/uL (ref 3.87–5.11)
RDW: 11.5 % (ref 11.5–15.5)
WBC: 6 10*3/uL (ref 4.0–10.5)

## 2016-06-13 LAB — COMPREHENSIVE METABOLIC PANEL
ALK PHOS: 34 U/L — AB (ref 39–117)
ALT: 11 U/L (ref 0–35)
AST: 13 U/L (ref 0–37)
Albumin: 4.6 g/dL (ref 3.5–5.2)
BILIRUBIN TOTAL: 0.6 mg/dL (ref 0.2–1.2)
BUN: 16 mg/dL (ref 6–23)
CALCIUM: 9.5 mg/dL (ref 8.4–10.5)
CHLORIDE: 106 meq/L (ref 96–112)
CO2: 25 meq/L (ref 19–32)
CREATININE: 0.74 mg/dL (ref 0.40–1.20)
GFR: 94.47 mL/min (ref >60.00)
GLUCOSE: 86 mg/dL (ref 70–99)
Potassium: 4.2 mEq/L (ref 3.5–5.1)
SODIUM: 139 meq/L (ref 135–145)
Total Protein: 7.4 g/dL (ref 6.0–8.3)

## 2016-06-13 LAB — LIPID PANEL
CHOLESTEROL: 170 mg/dL (ref 0–200)
HDL: 47 mg/dL (ref >39.00)
LDL CALC: 107 mg/dL — AB (ref 0–99)
NonHDL: 122.61
Total CHOL/HDL Ratio: 4
Triglycerides: 77 mg/dL (ref 0.0–149.0)
VLDL: 15.4 mg/dL (ref 0.0–40.0)

## 2016-06-13 LAB — TSH: TSH: 1.37 u[IU]/mL (ref 0.35–4.50)

## 2016-06-13 MED ORDER — ALPRAZOLAM 0.25 MG PO TABS: 0.2500 mg | ORAL_TABLET | Freq: Three times a day (TID) | ORAL | Status: DC | PRN

## 2016-06-13 NOTE — Assessment & Plan Note (Signed)
Uses xanax as needed - does not take often Refilled today

## 2016-06-13 NOTE — Patient Instructions (Addendum)
Start taking calcium and vitamin D daily.  Test(s) ordered today. Your results will be released to [place] (or called to you) after review, usually within 72hours after test completion. If any changes need to be made, you will be notified at that same time.  All other Health Maintenance issues reviewed.   All recommended immunizations and age-appropriate screenings are up-to-date or discussed.  No immunizations administered today.   Medications reviewed and updated.  No changes recommended at this time.  Your prescription(s) have been submitted to your pharmacy. Please take as directed and contact our office if you believe you are having problem(s) with the medication(s).  Please followup in one year  Health Maintenance, Female Adopting a healthy lifestyle and getting preventive care can go a long way to promote health and wellness. Talk with your health care provider about what schedule of regular examinations is right for you. This is a good chance for you to check in with your provider about disease prevention and staying healthy. In between checkups, there are plenty of things you can do on your own. Experts have done a lot of research about which lifestyle changes and preventive measures are most likely to keep you healthy. Ask your health care provider for more information. WEIGHT AND DIET  Eat a healthy diet  Be sure to include plenty of vegetables, fruits, low-fat dairy products, and lean protein.  Do not eat a lot of foods high in solid fats, added sugars, or salt.  Get regular exercise. This is one of the most important things you can do for your health.  Most adults should exercise for at least 150 minutes each week. The exercise should increase your heart rate and make you sweat (moderate-intensity exercise).  Most adults should also do strengthening exercises at least twice a week. This is in addition to the moderate-intensity exercise.  Maintain a healthy weight  Body  mass index (BMI) is a measurement that can be used to identify possible weight problems. It estimates body fat based on height and weight. Your health care provider can help determine your BMI and help you achieve or maintain a healthy weight.  For females 81 years of age and older:   A BMI below 18.5 is considered underweight.  A BMI of 18.5 to 24.9 is normal.  A BMI of 25 to 29.9 is considered overweight.  A BMI of 30 and above is considered obese.  Watch levels of cholesterol and blood lipids  You should start having your blood tested for lipids and cholesterol at 36 years of age, then have this test every 5 years.  You may need to have your cholesterol levels checked more often if:  Your lipid or cholesterol levels are high.  You are older than 36 years of age.  You are at high risk for heart disease.  CANCER SCREENING   Lung Cancer  Lung cancer screening is recommended for adults 18-85 years old who are at high risk for lung cancer because of a history of smoking.  A yearly low-dose CT scan of the lungs is recommended for people who:  Currently smoke.  Have quit within the past 15 years.  Have at least a 30-pack-year history of smoking. A pack year is smoking an average of one pack of cigarettes a day for 1 year.  Yearly screening should continue until it has been 15 years since you quit.  Yearly screening should stop if you develop a health problem that would prevent you from having  lung cancer treatment.  Breast Cancer  Practice breast self-awareness. This means understanding how your breasts normally appear and feel.  It also means doing regular breast self-exams. Let your health care provider know about any changes, no matter how small.  If you are in your 20s or 30s, you should have a clinical breast exam (CBE) by a health care provider every 1-3 years as part of a regular health exam.  If you are 87 or older, have a CBE every year. Also consider having a  breast X-ray (mammogram) every year.  If you have a family history of breast cancer, talk to your health care provider about genetic screening.  If you are at high risk for breast cancer, talk to your health care provider about having an MRI and a mammogram every year.  Breast cancer gene (BRCA) assessment is recommended for women who have family members with BRCA-related cancers. BRCA-related cancers include:  Breast.  Ovarian.  Tubal.  Peritoneal cancers.  Results of the assessment will determine the need for genetic counseling and BRCA1 and BRCA2 testing. Cervical Cancer Your health care provider may recommend that you be screened regularly for cancer of the pelvic organs (ovaries, uterus, and vagina). This screening involves a pelvic examination, including checking for microscopic changes to the surface of your cervix (Pap test). You may be encouraged to have this screening done every 3 years, beginning at age 46.  For women ages 37-65, health care providers may recommend pelvic exams and Pap testing every 3 years, or they may recommend the Pap and pelvic exam, combined with testing for human papilloma virus (HPV), every 5 years. Some types of HPV increase your risk of cervical cancer. Testing for HPV may also be done on women of any age with unclear Pap test results.  Other health care providers may not recommend any screening for nonpregnant women who are considered low risk for pelvic cancer and who do not have symptoms. Ask your health care provider if a screening pelvic exam is right for you.  If you have had past treatment for cervical cancer or a condition that could lead to cancer, you need Pap tests and screening for cancer for at least 20 years after your treatment. If Pap tests have been discontinued, your risk factors (such as having a new sexual partner) need to be reassessed to determine if screening should resume. Some women have medical problems that increase the chance of  getting cervical cancer. In these cases, your health care provider may recommend more frequent screening and Pap tests. Colorectal Cancer  This type of cancer can be detected and often prevented.  Routine colorectal cancer screening usually begins at 36 years of age and continues through 36 years of age.  Your health care provider may recommend screening at an earlier age if you have risk factors for colon cancer.  Your health care provider may also recommend using home test kits to check for hidden blood in the stool.  A small camera at the end of a tube can be used to examine your colon directly (sigmoidoscopy or colonoscopy). This is done to check for the earliest forms of colorectal cancer.  Routine screening usually begins at age 12.  Direct examination of the colon should be repeated every 5-10 years through 36 years of age. However, you may need to be screened more often if early forms of precancerous polyps or small growths are found. Skin Cancer  Check your skin from head to toe regularly.  Tell your health care provider about any new moles or changes in moles, especially if there is a change in a mole's shape or color.  Also tell your health care provider if you have a mole that is larger than the size of a pencil eraser.  Always use sunscreen. Apply sunscreen liberally and repeatedly throughout the day.  Protect yourself by wearing long sleeves, pants, a wide-brimmed hat, and sunglasses whenever you are outside. HEART DISEASE, DIABETES, AND HIGH BLOOD PRESSURE   High blood pressure causes heart disease and increases the risk of stroke. High blood pressure is more likely to develop in:  People who have blood pressure in the high end of the normal range (130-139/85-89 mm Hg).  People who are overweight or obese.  People who are African American.  If you are 66-50 years of age, have your blood pressure checked every 3-5 years. If you are 11 years of age or older, have  your blood pressure checked every year. You should have your blood pressure measured twice--once when you are at a hospital or clinic, and once when you are not at a hospital or clinic. Record the average of the two measurements. To check your blood pressure when you are not at a hospital or clinic, you can use:  An automated blood pressure machine at a pharmacy.  A home blood pressure monitor.  If you are between 58 years and 28 years old, ask your health care provider if you should take aspirin to prevent strokes.  Have regular diabetes screenings. This involves taking a blood sample to check your fasting blood sugar level.  If you are at a normal weight and have a low risk for diabetes, have this test once every three years after 36 years of age.  If you are overweight and have a high risk for diabetes, consider being tested at a younger age or more often. PREVENTING INFECTION  Hepatitis B  If you have a higher risk for hepatitis B, you should be screened for this virus. You are considered at high risk for hepatitis B if:  You were born in a country where hepatitis B is common. Ask your health care provider which countries are considered high risk.  Your parents were born in a high-risk country, and you have not been immunized against hepatitis B (hepatitis B vaccine).  You have HIV or AIDS.  You use needles to inject street drugs.  You live with someone who has hepatitis B.  You have had sex with someone who has hepatitis B.  You get hemodialysis treatment.  You take certain medicines for conditions, including cancer, organ transplantation, and autoimmune conditions. Hepatitis C  Blood testing is recommended for:  Everyone born from [birth year] through 1966.  Anyone with known risk factors for hepatitis C. Sexually transmitted infections (STIs)  You should be screened for sexually transmitted infections (STIs) including gonorrhea and chlamydia if:  You are sexually active and  are younger than 36 years of age.  You are older than 36 years of age and your health care provider tells you that you are at risk for this type of infection.  Your sexual activity has changed since you were last screened and you are at an increased risk for chlamydia or gonorrhea. Ask your health care provider if you are at risk.  If you do not have HIV, but are at risk, it may be recommended that you take a prescription medicine daily to prevent HIV infection. This is called pre-exposure  prophylaxis (PrEP). You are considered at risk if:  You are sexually active and do not regularly use condoms or know the HIV status of your partner(s).  You take drugs by injection.  You are sexually active with a partner who has HIV. Talk with your health care provider about whether you are at high risk of being infected with HIV. If you choose to begin PrEP, you should first be tested for HIV. You should then be tested every 3 months for as long as you are taking PrEP.  PREGNANCY   If you are premenopausal and you may become pregnant, ask your health care provider about preconception counseling.  If you may become pregnant, take 400 to 800 micrograms (mcg) of folic acid every day.  If you want to prevent pregnancy, talk to your health care provider about birth control (contraception). OSTEOPOROSIS AND MENOPAUSE   Osteoporosis is a disease in which the bones lose minerals and strength with aging. This can result in serious bone fractures. Your risk for osteoporosis can be identified using a bone density scan.  If you are 55 years of age or older, or if you are at risk for osteoporosis and fractures, ask your health care provider if you should be screened.  Ask your health care provider whether you should take a calcium or vitamin D supplement to lower your risk for osteoporosis.  Menopause may have certain physical symptoms and risks.  Hormone replacement therapy may reduce some of these symptoms  and risks. Talk to your health care provider about whether hormone replacement therapy is right for you.  HOME CARE INSTRUCTIONS   Schedule regular health, dental, and eye exams.  Stay current with your immunizations.   Do not use any tobacco products including cigarettes, chewing tobacco, or electronic cigarettes.  If you are pregnant, do not drink alcohol.  If you are breastfeeding, limit how much and how often you drink alcohol.  Limit alcohol intake to no more than 1 drink per day for nonpregnant women. One drink equals 12 ounces of beer, 5 ounces of wine, or 1 ounces of hard liquor.  Do not use street drugs.  Do not share needles.  Ask your health care provider for help if you need support or information about quitting drugs.  Tell your health care provider if you often feel depressed.  Tell your health care provider if you have ever been abused or do not feel safe at home.   This information is not intended to replace advice given to you by your health care provider. Make sure you discuss any questions you have with your health care provider.   Document Released: 05/26/2011 Document Revised: 12/01/2014 Document Reviewed: 10/12/2013 Elsevier Interactive Patient Education [employer].

## 2016-06-13 NOTE — Assessment & Plan Note (Addendum)
Started in HS Aura, nausea MRI normal Did not tolerate topamax in the past Excedrin migraine as needed No known triggers, can have a few a week or none for a couple of weeks Continue excedrin as needed

## 2016-06-13 NOTE — Progress Notes (Signed)
Subjective:    Patient ID: Summer Anderson, female    DOB: 1981/07/04, 35 y.o.   MRN: [id number]  HPI She is here to establish with a new pcp.  She is here for a physical exam.   Anxiety:  She uses xanax on occasion.  She does need a refill.  She uses when she has anxiety and tries not to take it often.  She denies side effects.  She denies depression.   Migraines:  She does not know her triggers and the migraines seem to occur randomly.  She sometimes gets a few in one week and then can go a couple of weeks without one.  She takes excedrin migraine as needed, which works.  She has had an MRI in the past, which was normal.   Medications and allergies reviewed with patient and updated if appropriate.  Patient Active Problem List   Diagnosis Date Noted  . Patella-femoral syndrome 06/11/2015  . Weight gain 09/22/2014  . Depression   . Migraines     Current Outpatient Prescriptions on File Prior to Visit  Medication Sig Dispense Refill  . ALPRAZolam (XANAX) 0.25 MG tablet Take 1 tablet (0.25 mg total) by mouth 3 (three) times daily as needed. 30 tablet 5  . levonorgestrel (MIRENA) 20 MCG/24HR IUD 1 Intra Uterine Device (1 each total) by Intrauterine route once. 1 each 0   No current facility-administered medications on file prior to visit.    Past Medical History  Diagnosis Date  . Depression     hx of, no medications since 12/15  . Migraines   . Kidney stones 2004  . Allergy   . Chronic headaches     Past Surgical History  Procedure Laterality Date  . Breast enhancement surgery  12/2013    Social History   Social History  . Marital Status: Married    Spouse Name: N/A  . Number of Children: N/A  . Years of Education: N/A   Social History Main Topics  . Smoking status: Never Smoker   . Smokeless tobacco: Not on file  . Alcohol Use: 0.0 oz/week    0 Standard drinks or equivalent per week     Comment: Occasioanally  . Drug Use: No  . Sexual Activity: Not on file    Other Topics Concern  . 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   She lives with husband and two daughters.           Family History  Problem Relation Age of Onset  . Acute myelogenous leukemia Father [id number]  . Parkinson's disease Maternal Grandfather     uncetain dx  . Healthy Mother   . Healthy Brother   . Healthy Sister   . Healthy Daughter     Review of Systems  Constitutional: Negative for fever, chills, appetite change, fatigue and unexpected weight change.  Eyes: Negative for visual disturbance.  Respiratory: Negative for cough, shortness of breath and wheezing.   Cardiovascular: Negative for chest pain, palpitations and leg swelling.  Gastrointestinal: Negative for nausea, abdominal pain, diarrhea, constipation and blood in stool.  Genitourinary: Negative for dysuria and hematuria.  Musculoskeletal: Negative for myalgias, back pain and arthralgias.  Skin: Negative for color change and rash.  Neurological: Positive for headaches (migraines). Negative for dizziness and light-headedness.  Psychiatric/Behavioral: Negative for dysphoric mood. The patient is nervous/anxious.        Objective:  Filed Vitals:   06/13/16 0759  BP: 112/74  Pulse: 74  Temp: 98.5 F (36.9 C)  Resp: 16   Filed Weights   06/13/16 0759  Weight: 130 lb (58.968 kg)   Body mass index is 22.3 kg/(m^2).   Physical Exam Constitutional: She appears well-developed and well-nourished. No distress.  HENT:  Head: Normocephalic and atraumatic.  Right Ear: External ear normal. Normal ear canal and TM Left Ear: External ear normal.  Normal ear canal and TM Mouth/Throat: Oropharynx is clear and moist.  Eyes: Conjunctivae and EOM are normal.  Neck: Neck supple. No tracheal deviation present. No thyromegaly present.  No carotid bruit  Cardiovascular: Normal rate, regular rhythm and normal heart sounds.   No murmur heard.  No  edema. Pulmonary/Chest: Effort normal and breath sounds normal. No respiratory distress. She has no wheezes. She has no rales.  Breast: deferred to Gyn Abdominal: Soft. She exhibits no distension. There is no tenderness.  Lymphadenopathy: She has no cervical adenopathy.  Skin: Skin is warm and dry. She is not diaphoretic.  Psychiatric: She has a normal mood and affect. Her behavior is normal.        Assessment & Plan:   Physical exam: Screening blood work ordered Immunizations  Up to date  Gyn   Up to date  Exercise - regular Weight - normal BMI Skin  - no concerns, wears sunscreen Substance abuse -  None Advised to start calcium and vitamin d daily  See Problem List for Assessment and Plan of chronic medical problems.   F/u annually

## 2016-06-13 NOTE — Progress Notes (Signed)
Pre visit review using our clinic review tool, if applicable. No additional management support is needed unless otherwise documented below in the visit note. 

## 2016-06-14 ENCOUNTER — Encounter: Payer: Self-pay | Admitting: Internal Medicine

## 2016-07-10 ENCOUNTER — Ambulatory Visit
Admission: RE | Admit: 2016-07-10 | Discharge: 2016-07-10 | Disposition: A | Discharge status: Home / Self Care | Payer: 59 | Source: Ambulatory Visit | Attending: Obstetrics and Gynecology | Admitting: Obstetrics and Gynecology

## 2016-07-10 DIAGNOSIS — N6011 Diffuse cystic mastopathy of right breast: Secondary | ICD-10-CM

## 2016-09-02 ENCOUNTER — Other Ambulatory Visit: Payer: Self-pay | Admitting: Obstetrics and Gynecology

## 2016-09-02 LAB — HM PAP SMEAR

## 2016-09-03 LAB — CYTOLOGY - PAP

## 2017-01-21 ENCOUNTER — Other Ambulatory Visit: Payer: Self-pay | Admitting: Internal Medicine

## 2017-01-22 NOTE — Telephone Encounter (Signed)
RX faxed to [facility] 

## 2017-06-18 ENCOUNTER — Encounter: Payer: Self-pay | Admitting: Internal Medicine

## 2017-06-18 DIAGNOSIS — Z0000 Encounter for general adult medical examination without abnormal findings: Secondary | ICD-10-CM

## 2017-06-20 NOTE — Progress Notes (Signed)
Subjective:    Patient ID: Summer Anderson, female    DOB: [birth year], 36 y.o.   MRN: [id number]  HPI She is here for a physical exam.   She has no concerns.  She has had some increased stress recently.  She has a stressful job and has been busy with her family. She has had a few more migraines than usual.    Medications and allergies reviewed with patient and updated if appropriate.  Patient Active Problem List   Diagnosis Date Noted  . Abnormal breast biopsy 06/13/2016  . Anxiety 06/13/2016  . Patella-femoral syndrome 06/11/2015  . Migraines     Current Outpatient Prescriptions on File Prior to Visit  Medication Sig Dispense Refill  . ALPRAZolam (XANAX) 0.25 MG tablet TAKE 1 TABLET BY MOUTH 3 TIMES DAILY AS NEEDED 30 tablet 2  . Dapsone (ACZONE) 7.5 % GEL Apply topically.    Marland Kitchen. levonorgestrel (MIRENA) 20 MCG/24HR IUD 1 Intra Uterine Device (1 each total) by Intrauterine route once. 1 each 0  . tretinoin (RETIN-A) 0.025 % cream APPLY TO FACE QHS PRN  0   No current facility-administered medications on file prior to visit.     Past Medical History:  Diagnosis Date  . Allergy   . Chronic headaches   . Depression    hx of, no medications since 12/15  . Kidney stones 2004  . Migraines     Past Surgical History:  Procedure Laterality Date  . BREAST BIOPSY Right    PSEUDOANGIOMATOUS STROMAL HYPERPLASIA (PASH).  Marland Kitchen. BREAST ENHANCEMENT SURGERY  12/2013    Social History   Social History  . Marital status: Married    Spouse name: N/A  . Number of children: 2  . Years of education: N/A   Social History Main Topics  . Smoking status: Never Smoker  . Smokeless tobacco: Not on file  . Alcohol use 0.0 oz/week     Comment: Occasioanally  . Drug use: No  . Sexual activity: Not on file   Other Topics Concern  . 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   She lives with husband and two daughters.    Exercise:  running          Family History  Problem Relation Age of Onset  . Acute myelogenous leukemia Father [id number]  . Parkinson's disease Maternal Grandfather        uncetain dx  . Healthy Mother   . Healthy Brother   . Healthy Sister   . Healthy Daughter     Review of Systems  Constitutional: Negative for chills, fatigue, fever and unexpected weight change.  Eyes: Negative for visual disturbance.  Respiratory: Negative for cough, shortness of breath and wheezing.   Cardiovascular: Negative for chest pain, palpitations and leg swelling.  Gastrointestinal: Negative for abdominal pain, blood in stool, constipation, diarrhea and nausea.       No gerd  Genitourinary: Negative for dysuria and hematuria.  Musculoskeletal: Negative for arthralgias and back pain.  Skin: Negative for color change and rash.  Neurological: Positive for headaches (migraines). Negative for light-headedness.  Psychiatric/Behavioral: Negative for dysphoric mood. The patient is nervous/anxious.        Objective:   Vitals:   06/22/17 1521  BP: 124/78  Pulse: 73  Resp: 16  Temp: 98.2 F (36.8 C)   Filed Weights   06/22/17 1521  Weight: 133 lb (60.3 kg)  Body mass index is 22.83 kg/m.  Wt Readings from Last 3 Encounters:  06/22/17 133 lb (60.3 kg)  06/13/16 130 lb (59 kg)  06/11/15 133 lb 4 oz (60.4 kg)     Physical Exam Constitutional: She appears well-developed and well-nourished. No distress.  HENT:  Head: Normocephalic and atraumatic.  Right Ear: External ear normal. Normal ear canal and TM Left Ear: External ear normal.  Normal ear canal and TM Mouth/Throat: Oropharynx is clear and moist.  Eyes: Conjunctivae and EOM are normal.  Neck: Neck supple. No tracheal deviation present. No thyromegaly present.  No carotid bruit  Cardiovascular: Normal rate, regular rhythm and normal heart sounds.   No murmur heard.  No edema. Pulmonary/Chest: Effort normal and breath sounds normal. No  respiratory distress. She has no wheezes. She has no rales.  Breast: deferred to Gyn Abdominal: Soft. She exhibits no distension. There is no tenderness.  Lymphadenopathy: She has no cervical adenopathy.  Skin: Skin is warm and dry. She is not diaphoretic.  Psychiatric: She has a normal mood and affect. Her behavior is normal.         Assessment & Plan:   Physical exam: Screening blood work  reviewed Immunizations  Up to date  Gyn  Up to date  Exercise - runs regularly - 1-3 times a week - will try to be more consistent Weight  Normal BMI Skin   No concerns Substance abuse  none  See Problem List for Assessment and Plan of chronic medical problems.  FU annually - sooner if needed

## 2017-06-22 ENCOUNTER — Encounter: Payer: Self-pay | Admitting: Internal Medicine

## 2017-06-22 ENCOUNTER — Other Ambulatory Visit (INDEPENDENT_AMBULATORY_CARE_PROVIDER_SITE_OTHER): Payer: 59

## 2017-06-22 ENCOUNTER — Ambulatory Visit (INDEPENDENT_AMBULATORY_CARE_PROVIDER_SITE_OTHER): Payer: 59 | Admitting: Internal Medicine

## 2017-06-22 VITALS — BP 124/78 | HR 73 | Temp 98.2°F | Resp 16 | Wt 133.0 lb

## 2017-06-22 DIAGNOSIS — F419 Anxiety disorder, unspecified: Secondary | ICD-10-CM

## 2017-06-22 DIAGNOSIS — G43109 Migraine with aura, not intractable, without status migrainosus: Secondary | ICD-10-CM

## 2017-06-22 DIAGNOSIS — Z0000 Encounter for general adult medical examination without abnormal findings: Secondary | ICD-10-CM | POA: Diagnosis not present

## 2017-06-22 LAB — COMPREHENSIVE METABOLIC PANEL
ALT: 11 U/L (ref 0–35)
AST: 14 U/L (ref 0–37)
Albumin: 4.3 g/dL (ref 3.5–5.2)
Alkaline Phosphatase: 35 U/L — ABNORMAL LOW (ref 39–117)
BILIRUBIN TOTAL: 0.7 mg/dL (ref 0.2–1.2)
BUN: 15 mg/dL (ref 6–23)
CHLORIDE: 107 meq/L (ref 96–112)
CO2: 27 meq/L (ref 19–32)
CREATININE: 0.72 mg/dL (ref 0.40–1.20)
Calcium: 9.3 mg/dL (ref 8.4–10.5)
GFR: 96.95 mL/min (ref >60.00)
GLUCOSE: 87 mg/dL (ref 70–99)
Potassium: 4.1 mEq/L (ref 3.5–5.1)
SODIUM: 141 meq/L (ref 135–145)
Total Protein: 6.9 g/dL (ref 6.0–8.3)

## 2017-06-22 LAB — LIPID PANEL
CHOL/HDL RATIO: 4
Cholesterol: 163 mg/dL (ref 0–200)
HDL: 41 mg/dL (ref >39.00)
LDL CALC: 103 mg/dL — AB (ref 0–99)
NONHDL: 122.29
Triglycerides: 94 mg/dL (ref 0.0–149.0)
VLDL: 18.8 mg/dL (ref 0.0–40.0)

## 2017-06-22 LAB — CBC WITH DIFFERENTIAL/PLATELET
Basophils Absolute: 0 10*3/uL (ref 0.0–0.1)
Basophils Relative: 0.6 % (ref 0.0–3.0)
EOS ABS: 0 10*3/uL (ref 0.0–0.7)
Eosinophils Relative: 0.8 % (ref 0.0–5.0)
HCT: 37 % (ref 36.0–46.0)
Hemoglobin: 12.8 g/dL (ref 12.0–15.0)
LYMPHS ABS: 1.7 10*3/uL (ref 0.7–4.0)
LYMPHS PCT: 39.9 % (ref 12.0–46.0)
MCHC: 34.5 g/dL (ref 30.0–36.0)
MCV: 96.7 fl (ref 78.0–100.0)
MONO ABS: 0.5 10*3/uL (ref 0.1–1.0)
Monocytes Relative: 10.8 % (ref 3.0–12.0)
NEUTROS ABS: 2 10*3/uL (ref 1.4–7.7)
NEUTROS PCT: 47.9 % (ref 43.0–77.0)
PLATELETS: 200 10*3/uL (ref 150.0–400.0)
RBC: 3.82 Mil/uL — ABNORMAL LOW (ref 3.87–5.11)
RDW: 11.6 % (ref 11.5–15.5)
WBC: 4.2 10*3/uL (ref 4.0–10.5)

## 2017-06-22 LAB — TSH: TSH: 0.99 u[IU]/mL (ref 0.35–4.50)

## 2017-06-22 MED ORDER — ASPIRIN-ACETAMINOPHEN-CAFFEINE 250-250-65 MG PO TABS: 1.0000 | ORAL_TABLET | Freq: Four times a day (QID) | ORAL | 0 refills | Status: AC | PRN

## 2017-06-22 NOTE — Assessment & Plan Note (Signed)
Having some increased cluster of migraines Taking excedrin migraine as needed, which works most of the time, but not always Has tried maxalt and imitrex in the past Can consider maxalt again or zomig - she will let me know

## 2017-06-22 NOTE — Assessment & Plan Note (Signed)
Taking xanax as needed only It works well Will continue

## 2017-06-22 NOTE — Patient Instructions (Addendum)
All other Health Maintenance issues reviewed.   All recommended immunizations and age-appropriate screenings are up-to-date or discussed.  No immunizations administered today.   Medications reviewed and updated.  No changes recommended at this time.  Please followup in one year   Health Maintenance, Female Adopting a healthy lifestyle and getting preventive care can go a long way to promote health and wellness. Talk with your health care provider about what schedule of regular examinations is right for you. This is a good chance for you to check in with your provider about disease prevention and staying healthy. In between checkups, there are plenty of things you can do on your own. Experts have done a lot of research about which lifestyle changes and preventive measures are most likely to keep you healthy. Ask your health care provider for more information. Weight and diet Eat a healthy diet  Be sure to include plenty of vegetables, fruits, low-fat dairy products, and lean protein.  Do not eat a lot of foods high in solid fats, added sugars, or salt.  Get regular exercise. This is one of the most important things you can do for your health. ? Most adults should exercise for at least 150 minutes each week. The exercise should increase your heart rate and make you sweat (moderate-intensity exercise). ? Most adults should also do strengthening exercises at least twice a week. This is in addition to the moderate-intensity exercise.  Maintain a healthy weight  Body mass index (BMI) is a measurement that can be used to identify possible weight problems. It estimates body fat based on height and weight. Your health care provider can help determine your BMI and help you achieve or maintain a healthy weight.  For females 20 years of age and older: ? A BMI below 18.5 is considered underweight. ? A BMI of 18.5 to 24.9 is normal. ? A BMI of 25 to 29.9 is considered overweight. ? A BMI of 30 and  above is considered obese.  Watch levels of cholesterol and blood lipids  You should start having your blood tested for lipids and cholesterol at 37 years of age, then have this test every 5 years.  You may need to have your cholesterol levels checked more often if: ? Your lipid or cholesterol levels are high. ? You are older than 37 years of age. ? You are at high risk for heart disease.  Cancer screening Lung Cancer  Lung cancer screening is recommended for adults 55-80 years old who are at high risk for lung cancer because of a history of smoking.  A yearly low-dose CT scan of the lungs is recommended for people who: ? Currently smoke. ? Have quit within the past 15 years. ? Have at least a 30-pack-year history of smoking. A pack year is smoking an average of one pack of cigarettes a day for 1 year.  Yearly screening should continue until it has been 15 years since you quit.  Yearly screening should stop if you develop a health problem that would prevent you from having lung cancer treatment.  Breast Cancer  Practice breast self-awareness. This means understanding how your breasts normally appear and feel.  It also means doing regular breast self-exams. Let your health care provider know about any changes, no matter how small.  If you are in your 20s or 30s, you should have a clinical breast exam (CBE) by a health care provider every 1-3 years as part of a regular health exam.  If you   If you are 40 or older, have a CBE every year. Also consider having a breast X-ray (mammogram) every year.  If you have a family history of breast cancer, talk to your health care provider about genetic screening.  If you are at high risk for breast cancer, talk to your health care provider about having an MRI and a mammogram every year.  Breast cancer gene (BRCA) assessment is recommended for women who have family members with BRCA-related cancers. BRCA-related cancers  include: ? Breast. ? Ovarian. ? Tubal. ? Peritoneal cancers.  Results of the assessment will determine the need for genetic counseling and BRCA1 and BRCA2 testing.  Cervical Cancer Your health care provider may recommend that you be screened regularly for cancer of the pelvic organs (ovaries, uterus, and vagina). This screening involves a pelvic examination, including checking for microscopic changes to the surface of your cervix (Pap test). You may be encouraged to have this screening done every 3 years, beginning at age 21.  For women ages 30-65, health care providers may recommend pelvic exams and Pap testing every 3 years, or they may recommend the Pap and pelvic exam, combined with testing for human papilloma virus (HPV), every 5 years. Some types of HPV increase your risk of cervical cancer. Testing for HPV may also be done on women of any age with unclear Pap test results.  Other health care providers may not recommend any screening for nonpregnant women who are considered low risk for pelvic cancer and who do not have symptoms. Ask your health care provider if a screening pelvic exam is right for you.  If you have had past treatment for cervical cancer or a condition that could lead to cancer, you need Pap tests and screening for cancer for at least 20 years after your treatment. If Pap tests have been discontinued, your risk factors (such as having a new sexual partner) need to be reassessed to determine if screening should resume. Some women have medical problems that increase the chance of getting cervical cancer. In these cases, your health care provider may recommend more frequent screening and Pap tests.  Colorectal Cancer  This type of cancer can be detected and often prevented.  Routine colorectal cancer screening usually begins at 37 years of age and continues through 37 years of age.  Your health care provider may recommend screening at an earlier age if you have risk factors  for colon cancer.  Your health care provider may also recommend using home test kits to check for hidden blood in the stool.  A small camera at the end of a tube can be used to examine your colon directly (sigmoidoscopy or colonoscopy). This is done to check for the earliest forms of colorectal cancer.  Routine screening usually begins at age 50.  Direct examination of the colon should be repeated every 5-10 years through 37 years of age. However, you may need to be screened more often if early forms of precancerous polyps or small growths are found.  Skin Cancer  Check your skin from head to toe regularly.  Tell your health care provider about any new moles or changes in moles, especially if there is a change in a mole's shape or color.  Also tell your health care provider if you have a mole that is larger than the size of a pencil eraser.  Always use sunscreen. Apply sunscreen liberally and repeatedly throughout the day.  Protect yourself by wearing long sleeves, pants, a wide-brimmed hat, and   sunglasses whenever you are outside.  Heart disease, diabetes, and high blood pressure  High blood pressure causes heart disease and increases the risk of stroke. High blood pressure is more likely to develop in: ? People who have blood pressure in the high end of the normal range (130-139/85-89 mm Hg). ? People who are overweight or obese. ? People who are African American.  If you are 25-70 years of age, have your blood pressure checked every 3-5 years. If you are 86 years of age or older, have your blood pressure checked every year. You should have your blood pressure measured twice-once when you are at a hospital or clinic, and once when you are not at a hospital or clinic. Record the average of the two measurements. To check your blood pressure when you are not at a hospital or clinic, you can use: ? An automated blood pressure machine at a pharmacy. ? A home blood pressure monitor.  If  you are between 34 years and 33 years old, ask your health care provider if you should take aspirin to prevent strokes.  Have regular diabetes screenings. This involves taking a blood sample to check your fasting blood sugar level. ? If you are at a normal weight and have a low risk for diabetes, have this test once every three years after 37 years of age. ? If you are overweight and have a high risk for diabetes, consider being tested at a younger age or more often. Preventing infection Hepatitis B  If you have a higher risk for hepatitis B, you should be screened for this virus. You are considered at high risk for hepatitis B if: ? You were born in a country where hepatitis B is common. Ask your health care provider which countries are considered high risk. ? Your parents were born in a high-risk country, and you have not been immunized against hepatitis B (hepatitis B vaccine). ? You have HIV or AIDS. ? You use needles to inject street drugs. ? You live with someone who has hepatitis B. ? You have had sex with someone who has hepatitis B. ? You get hemodialysis treatment. ? You take certain medicines for conditions, including cancer, organ transplantation, and autoimmune conditions.  Hepatitis C  Blood testing is recommended for: ? Everyone born from [birth year] through 1966. ? Anyone with known risk factors for hepatitis C.  Sexually transmitted infections (STIs)  You should be screened for sexually transmitted infections (STIs) including gonorrhea and chlamydia if: ? You are sexually active and are younger than 37 years of age. ? You are older than 37 years of age and your health care provider tells you that you are at risk for this type of infection. ? Your sexual activity has changed since you were last screened and you are at an increased risk for chlamydia or gonorrhea. Ask your health care provider if you are at risk.  If you do not have HIV, but are at risk, it may be recommended  that you take a prescription medicine daily to prevent HIV infection. This is called pre-exposure prophylaxis (PrEP). You are considered at risk if: ? You are sexually active and do not regularly use condoms or know the HIV status of your partner(s). ? You take drugs by injection. ? You are sexually active with a partner who has HIV.  Talk with your health care provider about whether you are at high risk of being infected with HIV. If you choose to begin PrEP, you  should first be tested for HIV. You should then be tested every 3 months for as long as you are taking PrEP. Pregnancy  If you are premenopausal and you may become pregnant, ask your health care provider about preconception counseling.  If you may become pregnant, take 400 to 800 micrograms (mcg) of folic acid every day.  If you want to prevent pregnancy, talk to your health care provider about birth control (contraception). Osteoporosis and menopause  Osteoporosis is a disease in which the bones lose minerals and strength with aging. This can result in serious bone fractures. Your risk for osteoporosis can be identified using a bone density scan.  If you are 65 years of age or older, or if you are at risk for osteoporosis and fractures, ask your health care provider if you should be screened.  Ask your health care provider whether you should take a calcium or vitamin D supplement to lower your risk for osteoporosis.  Menopause may have certain physical symptoms and risks.  Hormone replacement therapy may reduce some of these symptoms and risks. Talk to your health care provider about whether hormone replacement therapy is right for you. Follow these instructions at home:  Schedule regular health, dental, and eye exams.  Stay current with your immunizations.  Do not use any tobacco products including cigarettes, chewing tobacco, or electronic cigarettes.  If you are pregnant, do not drink alcohol.  If you are  breastfeeding, limit how much and how often you drink alcohol.  Limit alcohol intake to no more than 1 drink per day for nonpregnant women. One drink equals 12 ounces of beer, 5 ounces of wine, or 1 ounces of hard liquor.  Do not use street drugs.  Do not share needles.  Ask your health care provider for help if you need support or information about quitting drugs.  Tell your health care provider if you often feel depressed.  Tell your health care provider if you have ever been abused or do not feel safe at home. This information is not intended to replace advice given to you by your health care provider. Make sure you discuss any questions you have with your health care provider. Document Released: 05/26/2011 Document Revised: 04/17/2016 Document Reviewed: 08/14/2015 Elsevier Interactive Patient Education  2018 [employer] Inc[employer]  

## 2017-06-23 ENCOUNTER — Encounter: Payer: Self-pay | Admitting: Internal Medicine

## 2017-09-01 ENCOUNTER — Encounter: Payer: Self-pay | Admitting: Internal Medicine

## 2017-09-14 DIAGNOSIS — Z124 Encounter for screening for malignant neoplasm of cervix: Secondary | ICD-10-CM | POA: Diagnosis not present

## 2017-09-14 DIAGNOSIS — Z3202 Encounter for pregnancy test, result negative: Secondary | ICD-10-CM | POA: Diagnosis not present

## 2017-09-14 DIAGNOSIS — Z6822 Body mass index (BMI) 22.0-22.9, adult: Secondary | ICD-10-CM | POA: Diagnosis not present

## 2017-09-14 DIAGNOSIS — Z01419 Encounter for gynecological examination (general) (routine) without abnormal findings: Secondary | ICD-10-CM | POA: Diagnosis not present

## 2017-09-15 ENCOUNTER — Encounter: Payer: Self-pay | Admitting: Internal Medicine

## 2017-09-17 ENCOUNTER — Encounter: Payer: Self-pay | Admitting: Internal Medicine

## 2017-09-17 NOTE — Telephone Encounter (Signed)
Call her - she should be seen by me or anyone.

## 2017-09-18 ENCOUNTER — Encounter: Payer: Self-pay | Admitting: Internal Medicine

## 2017-09-18 ENCOUNTER — Ambulatory Visit (INDEPENDENT_AMBULATORY_CARE_PROVIDER_SITE_OTHER): Payer: BLUE CROSS/BLUE SHIELD | Admitting: Internal Medicine

## 2017-09-18 VITALS — BP 124/72 | HR 82 | Temp 98.5°F | Resp 16 | Wt 132.0 lb

## 2017-09-18 DIAGNOSIS — J209 Acute bronchitis, unspecified: Secondary | ICD-10-CM | POA: Diagnosis not present

## 2017-09-18 DIAGNOSIS — G43109 Migraine with aura, not intractable, without status migrainosus: Secondary | ICD-10-CM

## 2017-09-18 HISTORY — DX: Acute bronchitis, unspecified: J20.9

## 2017-09-18 MED ORDER — CEFDINIR 300 MG PO CAPS: 300.0000 mg | ORAL_CAPSULE | Freq: Two times a day (BID) | ORAL | 0 refills | Status: DC

## 2017-09-18 MED ORDER — ELETRIPTAN HYDROBROMIDE 40 MG PO TABS: 40.0000 mg | ORAL_TABLET | ORAL | 5 refills | Status: DC | PRN

## 2017-09-18 MED FILL — ELETRIPTAN HBR 40 MG TABLET: 40 | 20 days supply | Qty: 10 | Fill #0

## 2017-09-18 MED FILL — CEFDINIR 300 MG CAPSULE: 300 | 10 days supply | Qty: 20 | Fill #0

## 2017-09-18 NOTE — Patient Instructions (Addendum)
Take the antibiotic as prescribed.  Continue the over the counter cold medications for symptom relief.     Medications reviewed and updated.  Changes include trying relpax for your migraines.  Your prescription(s) have been submitted to your pharmacy. Please take as directed and contact our office if you believe you are having problem(s) with the medication(s).  Call if no improvement

## 2017-09-18 NOTE — Telephone Encounter (Signed)
Pt schedule for 11 am today.

## 2017-09-18 NOTE — Assessment & Plan Note (Signed)
Probable bacterial in nature We will start Omnicef twice daily times 10 days Over-the-counter cold medications as needed Call if no improvement

## 2017-09-18 NOTE — Assessment & Plan Note (Signed)
Recently she had a more severe migraine Excedrin Migraine was not effective Would like to try another triptan Sumatriptan was not effective Maxalt worked for a while, but then stopped being effective, she believes she tried Zomig, but is unsure if it was effective We will try Relpax-prescription sent She will let me know if this is not effective

## 2017-09-18 NOTE — Progress Notes (Signed)
Subjective:    Patient ID: Summer Anderson, female    DOB: 1981-11-06, 36 y.o.   MRN: [id number]  HPI She is here for an acute visit for cold symptoms.   Her symptoms started several days ago.  She is experiencing laryngitis, deep cough that is painful - worse in morning and night - it is productive, nasal congestion with discolored mucus, sore throat and some mild wheezing.  She denies fever or SOB.  She has tried taking mucinex dm.   Migraine headaches: She is a long history of migraine headaches.  They have been better in the past several years and she has only been taking Excedrin Migraine as needed.  Recently she had a severe migraine and the Excedrin Migraine was not effective.  She has used triptan's in the past and wondered about trying that again.  Medications and allergies reviewed with patient and updated if appropriate.  Patient Active Problem List   Diagnosis Date Noted  . Abnormal breast biopsy 06/13/2016  . Anxiety 06/13/2016  . Patella-femoral syndrome 06/11/2015  . Migraines     Current Outpatient Prescriptions on File Prior to Visit  Medication Sig Dispense Refill  . ALPRAZolam (XANAX) 0.25 MG tablet TAKE 1 TABLET BY MOUTH 3 TIMES DAILY AS NEEDED 30 tablet 2  . aspirin-acetaminophen-caffeine (EXCEDRIN MIGRAINE) 250-250-65 MG tablet Take 1 tablet by mouth every 6 (six) hours as needed for headache. 30 tablet 0  . Biotin 4098110000 MCG TABS Take by mouth daily.    . Cholecalciferol (VITAMIN D-3) 5000 units TABS Take 5,000 Units by mouth daily.    . Dapsone (ACZONE) 7.5 % GEL Apply topically.    Marland Kitchen. levonorgestrel (MIRENA) 20 MCG/24HR IUD 1 Intra Uterine Device (1 each total) by Intrauterine route once. 1 each 0  . tretinoin (RETIN-A) 0.025 % cream APPLY TO FACE QHS PRN  0   No current facility-administered medications on file prior to visit.     Past Medical History:  Diagnosis Date  . Allergy   . Chronic headaches   . Depression    hx of, no medications since 12/15    . Kidney stones 2004  . Migraines     Past Surgical History:  Procedure Laterality Date  . BREAST BIOPSY Right    PSEUDOANGIOMATOUS STROMAL HYPERPLASIA (PASH).  Marland Kitchen. BREAST ENHANCEMENT SURGERY  12/2013    Social History   Social History  . Marital status: Married    Spouse name: N/A  . Number of children: 2  . Years of education: N/A   Social History Main Topics  . Smoking status: Never Smoker  . Smokeless tobacco: Never Used  . Alcohol use 0.0 oz/week     Comment: Occasioanally  . Drug use: No  . Sexual activity: Not on file   Other Topics Concern  . 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   She lives with husband and two daughters.    Exercise:  running          Family History  Problem Relation Age of Onset  . Acute myelogenous leukemia Father [id number]  . Parkinson's disease Maternal Grandfather        uncetain dx  . Healthy Mother   . Healthy Brother   . Healthy Sister   . Healthy Daughter     Review of Systems  Constitutional: Negative for chills and fever.  HENT: Positive for congestion (yellow - green  mucus), sore throat and voice change. Negative for ear pain and sinus pain.   Respiratory: Positive for cough (productive) and wheezing (mild at times). Negative for chest tightness and shortness of breath.   Neurological: Negative for headaches.       Objective:   Vitals:   09/18/17 1115  BP: 124/72  Pulse: 82  Resp: 16  Temp: 98.5 F (36.9 C)  SpO2: 99%   Filed Weights   09/18/17 1115  Weight: 132 lb (59.9 kg)   Body mass index is 22.66 kg/m.  Wt Readings from Last 3 Encounters:  09/18/17 132 lb (59.9 kg)  06/22/17 133 lb (60.3 kg)  06/13/16 130 lb (59 kg)     Physical Exam GENERAL APPEARANCE: Appears stated age, well appearing, NAD EYES: conjunctiva clear, no icterus HEENT: bilateral tympanic membranes and ear canals normal, oropharynx with mild erythema, no  thyromegaly, trachea midline, no cervical or supraclavicular lymphadenopathy LUNGS: Clear to auscultation without wheeze or crackles, unlabored breathing, good air entry bilaterally HEART: Normal S1,S2 without murmurs EXTREMITIES: Without clubbing, cyanosis, or edema        Assessment & Plan:   See Problem List for Assessment and Plan of chronic medical problems.

## 2017-09-22 ENCOUNTER — Encounter: Payer: Self-pay | Admitting: Internal Medicine

## 2017-09-25 MED ORDER — FLUCONAZOLE 150 MG PO TABS: 150.0000 mg | ORAL_TABLET | Freq: Once | ORAL | 0 refills | Status: AC

## 2017-09-28 MED FILL — FLUCONAZOLE 150 MG TABLET: 150 | 1 days supply | Qty: 1 | Fill #0

## 2017-10-14 ENCOUNTER — Telehealth: Payer: Self-pay | Admitting: Internal Medicine

## 2017-10-14 NOTE — Telephone Encounter (Signed)
Pt agreed to go to ED 

## 2017-10-14 NOTE — Telephone Encounter (Signed)
°  Patient Name: Summer Anderson  DOB: 05-29-1981    Initial Comment Caller states she has a history of migraines. She is currently having one that is lasting for 13 hours. She states it hurts to talk and to move. The pain has caused her to get nauseas and she has vomited 5 times.    Nurse Assessment  Nurse: Reed Pandyamsey, RN, Amy Date/Time Lamount Cohen(Eastern Time): 10/14/2017 12:00:07 PM  Confirm and document reason for call. If symptomatic, describe symptoms. ---Caller states she has a history of migraines and this one has lasted for 13 hours. Has taken migraine medication and it's not helping. Hurts to move or talk. Nausea and has vomited 5 times. States that usually her migraines only last 1-2 hours, she's never had one that lasted this long.  Does the patient have any new or worsening symptoms? ---Yes  Will a triage be completed? ---Yes  Related visit to physician within the last 2 weeks? ---No  Does the PT have any chronic conditions? (i.e. diabetes, asthma, etc.) ---Yes  List chronic conditions. ---Migraines  Is the patient pregnant or possibly pregnant? (Ask all females between the ages of [id number]) ---No  Is this a behavioral health or substance abuse call? ---No     Guidelines    Guideline Title Affirmed Question Affirmed Notes  Headache [1] SEVERE headache (e.g., excruciating) AND [2] "worst headache" of life    Final Disposition User   Go to ED Now (or PCP triage) Reed Pandyamsey, RN, Amy    Referrals  [facility] - ED   Caller Disagree/Comply Comply  Caller Understands Yes  PreDisposition Go to ED

## 2017-10-28 ENCOUNTER — Other Ambulatory Visit: Payer: Self-pay | Admitting: Internal Medicine

## 2017-10-28 NOTE — Telephone Encounter (Signed)
[place] Controlled Substance Database checked. Last filled on 04/09/17

## 2017-11-26 ENCOUNTER — Encounter: Payer: Self-pay | Admitting: Internal Medicine

## 2017-11-27 MED ORDER — AMITRIPTYLINE HCL 10 MG PO TABS: 10.0000 mg | ORAL_TABLET | Freq: Every day | ORAL | 5 refills | Status: DC

## 2017-11-27 MED ORDER — ZOLMITRIPTAN 5 MG PO TABS: 5.0000 mg | ORAL_TABLET | ORAL | 0 refills | Status: DC | PRN

## 2017-11-27 MED FILL — AMITRIPTYLINE HCL 10 MG TAB: 10 | 30 days supply | Qty: 30 | Fill #0

## 2017-11-27 MED FILL — ZOLMitriptan 5 MG TABS: 5 | 10 days supply | Qty: 10 | Fill #0

## 2017-11-27 NOTE — Addendum Note (Signed)
Addended by: Pincus SanesBURNS, STACY J on: 11/27/2017 07:57 AM   Modules accepted: Orders

## 2017-12-18 ENCOUNTER — Encounter: Payer: Self-pay | Admitting: Internal Medicine

## 2017-12-22 MED ORDER — AMITRIPTYLINE HCL 10 MG PO TABS: 20.0000 mg | ORAL_TABLET | Freq: Every day | ORAL | 5 refills | Status: DC

## 2017-12-22 MED FILL — AMITRIPTYLINE HCL 10 MG TAB: 10 | 30 days supply | Qty: 60 | Fill #0

## 2017-12-22 NOTE — Telephone Encounter (Signed)
Okay to refill? Continue with 10 mg tablets or change to 20 mg?

## 2018-01-25 MED FILL — AMITRIPTYLINE HCL 10 MG TAB: 10 | 30 days supply | Qty: 60 | Fill #1

## 2018-01-28 ENCOUNTER — Telehealth: Payer: Self-pay | Admitting: Internal Medicine

## 2018-01-28 ENCOUNTER — Encounter: Payer: Self-pay | Admitting: Family Medicine

## 2018-01-28 ENCOUNTER — Ambulatory Visit (INDEPENDENT_AMBULATORY_CARE_PROVIDER_SITE_OTHER): Payer: BLUE CROSS/BLUE SHIELD | Admitting: Family Medicine

## 2018-01-28 VITALS — BP 116/68 | HR 68 | Temp 98.5°F | Ht 64.0 in | Wt 132.0 lb

## 2018-01-28 DIAGNOSIS — J029 Acute pharyngitis, unspecified: Secondary | ICD-10-CM | POA: Insufficient documentation

## 2018-01-28 DIAGNOSIS — R21 Rash and other nonspecific skin eruption: Secondary | ICD-10-CM | POA: Diagnosis not present

## 2018-01-28 HISTORY — DX: Acute pharyngitis, unspecified: J02.9

## 2018-01-28 HISTORY — DX: Rash and other nonspecific skin eruption: R21

## 2018-01-28 LAB — POCT RAPID STREP A (OFFICE): Rapid Strep A Screen: POSITIVE — AB

## 2018-01-28 MED ORDER — AMOXICILLIN 500 MG PO CAPS: 500.0000 mg | ORAL_CAPSULE | Freq: Two times a day (BID) | ORAL | 0 refills | Status: DC

## 2018-01-28 MED FILL — AMOXICILLIN 500 MG CAPSULE: 500 | 10 days supply | Qty: 20 | Fill #0

## 2018-01-28 NOTE — Assessment & Plan Note (Signed)
Rapid strep positive  - amoxicillin  - counseled on supportive care

## 2018-01-28 NOTE — Telephone Encounter (Signed)
I have received FMLA via fax for the patient. I spoke with her about it and she is requesting it for her migraines. She was last seen in Oct 2018. I do see where you all have spoke about her issues with them in the past. She would like for it to be so if she comes in late or leaves early, as well as if she needs a full day. I informed her she may have to make an appointment to discuss the FMLA since it has not been talked about before. Please advise, Thank you.

## 2018-01-28 NOTE — Assessment & Plan Note (Signed)
Unclear if this is related viral exanthem or related to her strep infection. Mild in nature today  - could try zyrtec if occurs again  - f/u PRN

## 2018-01-28 NOTE — Progress Notes (Signed)
Summer Anderson - 37 y.o. female MRN [id number]  Date of birth: 1980-12-31  SUBJECTIVE:  Including CC & ROS.  Chief Complaint  Patient presents with  . Sore Throat  . Rash    Summer Anderson is a 37 y.o. female that is presenting with a sore throat and rash.  Sore throat has been ongoing for three days. Denies body aches or fevers. Has been painful to swallow. She has not been around anyone with similar symptoms. Her daughter had flu in January.  She works at [employer] center. She noticed a rash after her shower on her neck and back last night. Described as some redness and itchy. She took some zyrtec with some improvement.     Review of Systems  Constitutional: Negative for fever.  HENT: Positive for sore throat.   Respiratory: Negative for cough.   Cardiovascular: Negative for chest pain.  Gastrointestinal: Negative for abdominal pain.  Musculoskeletal: Negative for back pain.  Skin: Positive for rash.  Allergic/Immunologic: Negative for immunocompromised state.  Neurological: Negative for weakness.  Hematological: Positive for adenopathy.  Psychiatric/Behavioral: Negative for agitation.    HISTORY: Past Medical, Surgical, Social, and Family History Reviewed & Updated per EMR.   Pertinent Historical Findings include:  Past Medical History:  Diagnosis Date  . Allergy   . Chronic headaches   . Depression    hx of, no medications since 12/15  . Kidney stones 2004  . Migraines     Past Surgical History:  Procedure Laterality Date  . BREAST BIOPSY Right    PSEUDOANGIOMATOUS STROMAL HYPERPLASIA (PASH).  Marland Kitchen BREAST ENHANCEMENT SURGERY  12/2013    Allergies  Allergen Reactions  . Oxycodone-Acetaminophen Nausea Only    Family History  Problem Relation Age of Onset  . Acute myelogenous leukemia Father 51  . Parkinson's disease Maternal Grandfather        uncetain dx  . Healthy Mother   . Healthy Brother   . Healthy Sister   . Healthy Daughter      Social History    Socioeconomic History  . Marital status: Married    Spouse name: Not on file  . Number of children: 2  . Years of education: Not on file  . Highest education level: Not on file  Social Needs  . Financial resource strain: Not on file  . Food insecurity - worry: Not on file  . Food insecurity - inability: Not on file  . Transportation needs - medical: Not on file  . Transportation needs - non-medical: Not on file  Occupational History  . Not on file  Tobacco Use  . Smoking status: Never Smoker  . Smokeless tobacco: Never Used  Substance and Sexual Activity  . Alcohol use: Yes    Alcohol/week: 0.0 oz    Comment: Occasioanally  . Drug use: No  . Sexual activity: Not on file  Other Topics Concern  . 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   She lives with husband and two daughters.    Exercise:  running        PHYSICAL EXAM:  VS: BP 116/68 (BP Location: Left Arm, Patient Position: Sitting, Cuff Size: Normal)   Pulse 68   Temp 98.5 F (36.9 C) (Oral)   Ht 5\' 4"  (1.626 m)   Wt 132 lb (59.9 kg)   SpO2 99%   BMI 22.66 kg/m  Physical Exam Gen: NAD, alert, cooperative with  exam, well-appearing ENT: normal lips, normal nasal mucosa, tympanic membranes clear and intact bilaterally, normal oropharynx, mild anterior cervical lymphadenopathy, no tonsillar exudates  Eye: normal EOM, normal conjunctiva and lids CV:  no edema, +2 pedal pulses, regular rate and rhythm, S1-S2   Resp: no accessory muscle use, non-labored, clear to auscultation bilaterally, no crackles or wheezes Skin: no rashes, no areas of induration  Neuro: normal tone, normal sensation to touch Psych:  normal insight, alert and oriented MSK: Normal gait, normal strength       ASSESSMENT & PLAN:   Sore throat Rapid strep positive  - amoxicillin  - counseled on supportive care   Rash Unclear if this is related viral exanthem or related  to her strep infection. Mild in nature today  - could try zyrtec if occurs again  - f/u PRN

## 2018-01-28 NOTE — Patient Instructions (Signed)
Please complete the 10 day course of the antibiotic  Please let me know if the rash returns

## 2018-01-29 NOTE — Telephone Encounter (Signed)
Ok to fill out fmla - keep to a minimum time allowed out

## 2018-02-01 DIAGNOSIS — Z0279 Encounter for issue of other medical certificate: Secondary | ICD-10-CM

## 2018-02-01 NOTE — Telephone Encounter (Signed)
Forms have been completed & placed in providers box to review and sign.   I give the patient 2 days a month. Please advise if you would like something else. Thank you.

## 2018-02-01 NOTE — Telephone Encounter (Signed)
Forms have been faxed to [employer], copy sent to scan, original mailed to patient, &charged for.

## 2018-02-01 NOTE — Telephone Encounter (Signed)
signed

## 2018-02-24 MED FILL — AMITRIPTYLINE HCL 10 MG TAB: 10 | 30 days supply | Qty: 60 | Fill #2

## 2018-03-25 ENCOUNTER — Encounter: Payer: Self-pay | Admitting: Internal Medicine

## 2018-03-25 MED ORDER — AMITRIPTYLINE HCL 25 MG PO TABS: 25.0000 mg | ORAL_TABLET | Freq: Every day | ORAL | 5 refills | Status: DC

## 2018-03-25 MED ORDER — RIZATRIPTAN BENZOATE 10 MG PO TBDP: 10.0000 mg | ORAL_TABLET | ORAL | 0 refills | Status: DC | PRN

## 2018-03-25 NOTE — Telephone Encounter (Signed)
Does pt need appt.

## 2018-03-26 MED FILL — AMITRIPTYLINE HCL 10 MG TAB: 10 | 30 days supply | Qty: 60 | Fill #3

## 2018-03-27 MED ORDER — RIZATRIPTAN BENZOATE 10 MG PO TBDP: 10.0000 mg | ORAL_TABLET | ORAL | 11 refills | Status: DC | PRN

## 2018-03-27 MED ORDER — AMITRIPTYLINE HCL 25 MG PO TABS: 25.0000 mg | ORAL_TABLET | Freq: Every day | ORAL | 5 refills | Status: DC

## 2018-03-27 NOTE — Addendum Note (Signed)
Addended by: Pincus Sanes on: 03/27/2018 02:39 PM   Modules accepted: Orders

## 2018-03-29 MED FILL — AMITRIPTYLINE HCL 25 MG TAB: 25 | 30 days supply | Qty: 30 | Fill #0

## 2018-03-29 MED FILL — RIZATRIPTAN 10 MG ODT: 10 | 30 days supply | Qty: 10 | Fill #0

## 2018-05-11 MED FILL — AMITRIPTYLINE HCL 25 MG TAB: 25 | 30 days supply | Qty: 30 | Fill #1

## 2018-06-15 MED FILL — AMITRIPTYLINE HCL 25 MG TAB: 25 | 30 days supply | Qty: 30 | Fill #2

## 2018-07-11 ENCOUNTER — Encounter: Payer: Self-pay | Admitting: Internal Medicine

## 2018-07-11 NOTE — Progress Notes (Signed)
Subjective:    Patient ID: Summer Anderson, female    DOB: 1981-08-30, 37 y.o.   MRN: [id number]  HPI She is here for a physical exam.   She any changes in her health and has no concerns.   Migraines overall are controlled.  She is happy with her current regimen.  She has two daughters, now 7 and 10 and they start back at school next week.    Medications and allergies reviewed with patient and updated if appropriate.  Patient Active Problem List   Diagnosis Date Noted  . Rash 01/28/2018  . Abnormal breast biopsy 06/13/2016  . Anxiety 06/13/2016  . Patella-femoral syndrome 06/11/2015  . Migraines     Current Outpatient Medications on File Prior to Visit  Medication Sig Dispense Refill  . ALPRAZolam (XANAX) 0.25 MG tablet TAKE 1 TABLET BY MOUTH THREE TIMES DAILY AS NEEDED. 30 tablet 2  . amitriptyline (ELAVIL) 25 MG tablet Take 1 tablet (25 mg total) by mouth at bedtime. 30 tablet 5  . aspirin-acetaminophen-caffeine (EXCEDRIN MIGRAINE) 250-250-65 MG tablet Take 1 tablet by mouth every 6 (six) hours as needed for headache. 30 tablet 0  . Biotin 8657810000 MCG TABS Take by mouth daily.    . Cholecalciferol (VITAMIN D-3) 5000 units TABS Take 5,000 Units by mouth daily.    . Dapsone (ACZONE) 7.5 % GEL Apply topically.    Marland Kitchen. levonorgestrel (MIRENA) 20 MCG/24HR IUD 1 Intra Uterine Device (1 each total) by Intrauterine route once. 1 each 0  . rizatriptan (MAXALT-MLT) 10 MG disintegrating tablet Take 1 tablet (10 mg total) by mouth as needed for migraine. May repeat in 2 hours if needed 10 tablet 11  . tretinoin (RETIN-A) 0.025 % cream APPLY TO FACE QHS PRN  0   No current facility-administered medications on file prior to visit.     Past Medical History:  Diagnosis Date  . Allergy   . Chronic headaches   . Depression    hx of, no medications since 12/15  . Kidney stones 2004  . Migraines     Past Surgical History:  Procedure Laterality Date  . BREAST BIOPSY Right    PSEUDOANGIOMATOUS STROMAL HYPERPLASIA (PASH).  Marland Kitchen. BREAST ENHANCEMENT SURGERY  12/2013    Social History   Socioeconomic History  . Marital status: Married    Spouse name: Not on file  . Number of children: 2  . Years of education: Not on file  . Highest education level: Not on file  Occupational History  . Not on file  Social Needs  . Financial resource strain: Not on file  . Food insecurity:    Worry: Not on file    Inability: Not on file  . Transportation needs:    Medical: Not on file    Non-medical: Not on file  Tobacco Use  . Smoking status: Never Smoker  . Smokeless tobacco: Never Used  Substance and Sexual Activity  . Alcohol use: Yes    Alcohol/week: 0.0 standard drinks    Comment: Occasioanally  . Drug use: No  . Sexual activity: Not on file  Lifestyle  . Physical activity:    Days per week: Not on file    Minutes per session: Not on file  . Stress: Not on file  Relationships  . Social connections:    Talks on phone: Not on file    Gets together: Not on file    Attends religious service: Not on file    Active member of  club or organization: Not on file    Attends meetings of clubs or organizations: Not on file    Relationship status: Not on file  Other Topics Concern  . 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   She lives with husband and two daughters.    Exercise:  running       Family History  Problem Relation Age of Onset  . Acute myelogenous leukemia Father [id number]  . Healthy Mother   . Parkinson's disease Maternal Grandfather        uncetain dx  . Healthy Brother   . Healthy Sister   . Healthy Daughter     Review of Systems  Constitutional: Negative for chills and fever.  Eyes: Negative for visual disturbance.  Respiratory: Negative for cough, shortness of breath and wheezing.   Cardiovascular: Negative for chest pain, palpitations and leg swelling.  Gastrointestinal: Negative  for abdominal pain, blood in stool, constipation, diarrhea and nausea.       No gerd  Genitourinary: Negative for dysuria and hematuria.  Musculoskeletal: Negative for arthralgias, back pain and neck pain.  Skin: Negative for color change and rash.  Neurological: Positive for headaches. Negative for dizziness and light-headedness.  Psychiatric/Behavioral: Negative for dysphoric mood and sleep disturbance. The patient is nervous/anxious (occasional).        Objective:   Vitals:   07/13/18 0824  BP: 114/76  Pulse: 97  Resp: 16  Temp: 98.5 F (36.9 C)  SpO2: 97%   Filed Weights   07/13/18 0824  Weight: 133 lb (60.3 kg)   Body mass index is 22.83 kg/m.  Wt Readings from Last 3 Encounters:  07/13/18 133 lb (60.3 kg)  01/28/18 132 lb (59.9 kg)  09/18/17 132 lb (59.9 kg)     Physical Exam Constitutional: She appears well-developed and well-nourished. No distress.  HENT:  Head: Normocephalic and atraumatic.  Right Ear: External ear normal. Normal ear canal and TM Left Ear: External ear normal.  Normal ear canal and TM Mouth/Throat: Oropharynx is clear and moist.  Eyes: Conjunctivae and EOM are normal.  Neck: Neck supple. No tracheal deviation present. No thyromegaly present.  No carotid bruit  Cardiovascular: Normal rate, regular rhythm and normal heart sounds.   No murmur heard.  No edema. Pulmonary/Chest: Effort normal and breath sounds normal. No respiratory distress. She has no wheezes. She has no rales.  Breast: deferred to Gyn Abdominal: Soft. She exhibits no distension. There is no tenderness.  Lymphadenopathy: She has no cervical adenopathy.  Skin: Skin is warm and dry. She is not diaphoretic.  Psychiatric: She has a normal mood and affect. Her behavior is normal.        Assessment & Plan:   Physical exam: Screening blood work   ordered Immunizations   Up to date  Gyn       Up to date  Exercise    Not exercising regularly, but will try to get back into  regular exercise Weight  - BMI is normal, but not at her happy weight Skin      No concerns Substance abuse    none  See Problem List for Assessment and Plan of chronic medical problems.    FU in one year

## 2018-07-11 NOTE — Patient Instructions (Addendum)
Test(s) ordered today. Your results will be released to [facility] (or called to you) after review, usually within 72hours after test completion. If any changes need to be made, you will be notified at that same time.  All other Health Maintenance issues reviewed.   All recommended immunizations and age-appropriate screenings are up-to-date or discussed.  No immunizations administered today.   Medications reviewed and updated.  No changes recommended at this time.  Your prescription(s) have been submitted to your pharmacy. Please take as directed and contact our office if you believe you are having problem(s) with the medication(s).   Please followup in one year   Health Maintenance, Female Adopting a healthy lifestyle and getting preventive care can go a long way to promote health and wellness. Talk with your health care provider about what schedule of regular examinations is right for you. This is a good chance for you to check in with your provider about disease prevention and staying healthy. In between checkups, there are plenty of things you can do on your own. Experts have done a lot of research about which lifestyle changes and preventive measures are most likely to keep you healthy. Ask your health care provider for more information. Weight and diet Eat a healthy diet  Be sure to include plenty of vegetables, fruits, low-fat dairy products, and lean protein.  Do not eat a lot of foods high in solid fats, added sugars, or salt.  Get regular exercise. This is one of the most important things you can do for your health. ? Most adults should exercise for at least 150 minutes each week. The exercise should increase your heart rate and make you sweat (moderate-intensity exercise). ? Most adults should also do strengthening exercises at least twice a week. This is in addition to the moderate-intensity exercise.  Maintain a healthy weight  Body mass index (BMI) is a measurement that can  be used to identify possible weight problems. It estimates body fat based on height and weight. Your health care provider can help determine your BMI and help you achieve or maintain a healthy weight.  For females 37 years of age and older: ? A BMI below 18.5 is considered underweight. ? A BMI of 18.5 to 24.9 is normal. ? A BMI of 25 to 29.9 is considered overweight. ? A BMI of 30 and above is considered obese.  Watch levels of cholesterol and blood lipids  You should start having your blood tested for lipids and cholesterol at 38 years of age, then have this test every 5 years.  You may need to have your cholesterol levels checked more often if: ? Your lipid or cholesterol levels are high. ? You are older than 38 years of age. ? You are at high risk for heart disease.  Cancer screening Lung Cancer  Lung cancer screening is recommended for adults 67-73 years old who are at high risk for lung cancer because of a history of smoking.  A yearly low-dose CT scan of the lungs is recommended for people who: ? Currently smoke. ? Have quit within the past 15 years. ? Have at least a 30-pack-year history of smoking. A pack year is smoking an average of one pack of cigarettes a day for 1 year.  Yearly screening should continue until it has been 15 years since you quit.  Yearly screening should stop if you develop a health problem that would prevent you from having lung cancer treatment.  Breast Cancer  Practice breast self-awareness.  This means understanding how your breasts normally appear and feel.  It also means doing regular breast self-exams. Let your health care provider know about any changes, no matter how small.  If you are in your 20s or 30s, you should have a clinical breast exam (CBE) by a health care provider every 1-3 years as part of a regular health exam.  If you are 29 or older, have a CBE every year. Also consider having a breast X-ray (mammogram) every year.  If you  have a family history of breast cancer, talk to your health care provider about genetic screening.  If you are at high risk for breast cancer, talk to your health care provider about having an MRI and a mammogram every year.  Breast cancer gene (BRCA) assessment is recommended for women who have family members with BRCA-related cancers. BRCA-related cancers include: ? Breast. ? Ovarian. ? Tubal. ? Peritoneal cancers.  Results of the assessment will determine the need for genetic counseling and BRCA1 and BRCA2 testing.  Cervical Cancer Your health care provider may recommend that you be screened regularly for cancer of the pelvic organs (ovaries, uterus, and vagina). This screening involves a pelvic examination, including checking for microscopic changes to the surface of your cervix (Pap test). You may be encouraged to have this screening done every 3 years, beginning at age 40.  For women ages 44-65, health care providers may recommend pelvic exams and Pap testing every 3 years, or they may recommend the Pap and pelvic exam, combined with testing for human papilloma virus (HPV), every 5 years. Some types of HPV increase your risk of cervical cancer. Testing for HPV may also be done on women of any age with unclear Pap test results.  Other health care providers may not recommend any screening for nonpregnant women who are considered low risk for pelvic cancer and who do not have symptoms. Ask your health care provider if a screening pelvic exam is right for you.  If you have had past treatment for cervical cancer or a condition that could lead to cancer, you need Pap tests and screening for cancer for at least 20 years after your treatment. If Pap tests have been discontinued, your risk factors (such as having a new sexual partner) need to be reassessed to determine if screening should resume. Some women have medical problems that increase the chance of getting cervical cancer. In these cases,  your health care provider may recommend more frequent screening and Pap tests.  Colorectal Cancer  This type of cancer can be detected and often prevented.  Routine colorectal cancer screening usually begins at 37 years of age and continues through 38 years of age.  Your health care provider may recommend screening at an earlier age if you have risk factors for colon cancer.  Your health care provider may also recommend using home test kits to check for hidden blood in the stool.  A small camera at the end of a tube can be used to examine your colon directly (sigmoidoscopy or colonoscopy). This is done to check for the earliest forms of colorectal cancer.  Routine screening usually begins at age 96.  Direct examination of the colon should be repeated every 5-10 years through 38 years of age. However, you may need to be screened more often if early forms of precancerous polyps or small growths are found.  Skin Cancer  Check your skin from head to toe regularly.  Tell your health care provider about any  new moles or changes in moles, especially if there is a change in a mole's shape or color.  Also tell your health care provider if you have a mole that is larger than the size of a pencil eraser.  Always use sunscreen. Apply sunscreen liberally and repeatedly throughout the day.  Protect yourself by wearing long sleeves, pants, a wide-brimmed hat, and sunglasses whenever you are outside.  Heart disease, diabetes, and high blood pressure  High blood pressure causes heart disease and increases the risk of stroke. High blood pressure is more likely to develop in: ? People who have blood pressure in the high end of the normal range (130-139/85-89 mm Hg). ? People who are overweight or obese. ? People who are African American.  If you are 58-1 years of age, have your blood pressure checked every 3-5 years. If you are 52 years of age or older, have your blood pressure checked every year.  You should have your blood pressure measured twice-once when you are at a hospital or clinic, and once when you are not at a hospital or clinic. Record the average of the two measurements. To check your blood pressure when you are not at a hospital or clinic, you can use: ? An automated blood pressure machine at a pharmacy. ? A home blood pressure monitor.  If you are between 24 years and 65 years old, ask your health care provider if you should take aspirin to prevent strokes.  Have regular diabetes screenings. This involves taking a blood sample to check your fasting blood sugar level. ? If you are at a normal weight and have a low risk for diabetes, have this test once every three years after 38 years of age. ? If you are overweight and have a high risk for diabetes, consider being tested at a younger age or more often. Preventing infection Hepatitis B  If you have a higher risk for hepatitis B, you should be screened for this virus. You are considered at high risk for hepatitis B if: ? You were born in a country where hepatitis B is common. Ask your health care provider which countries are considered high risk. ? Your parents were born in a high-risk country, and you have not been immunized against hepatitis B (hepatitis B vaccine). ? You have HIV or AIDS. ? You use needles to inject street drugs. ? You live with someone who has hepatitis B. ? You have had sex with someone who has hepatitis B. ? You get hemodialysis treatment. ? You take certain medicines for conditions, including cancer, organ transplantation, and autoimmune conditions.  Hepatitis C  Blood testing is recommended for: ? Everyone born from [birth year] through 1966. ? Anyone with known risk factors for hepatitis C.  Sexually transmitted infections (STIs)  You should be screened for sexually transmitted infections (STIs) including gonorrhea and chlamydia if: ? You are sexually active and are younger than 38 years of  age. ? You are older than 38 years of age and your health care provider tells you that you are at risk for this type of infection. ? Your sexual activity has changed since you were last screened and you are at an increased risk for chlamydia or gonorrhea. Ask your health care provider if you are at risk.  If you do not have HIV, but are at risk, it may be recommended that you take a prescription medicine daily to prevent HIV infection. This is called pre-exposure prophylaxis (PrEP). You are considered at  risk if: ? You are sexually active and do not regularly use condoms or know the HIV status of your partner(s). ? You take drugs by injection. ? You are sexually active with a partner who has HIV.  Talk with your health care provider about whether you are at high risk of being infected with HIV. If you choose to begin PrEP, you should first be tested for HIV. You should then be tested every 3 months for as long as you are taking PrEP. Pregnancy  If you are premenopausal and you may become pregnant, ask your health care provider about preconception counseling.  If you may become pregnant, take 400 to 800 micrograms (mcg) of folic acid every day.  If you want to prevent pregnancy, talk to your health care provider about birth control (contraception). Osteoporosis and menopause  Osteoporosis is a disease in which the bones lose minerals and strength with aging. This can result in serious bone fractures. Your risk for osteoporosis can be identified using a bone density scan.  If you are 6 years of age or older, or if you are at risk for osteoporosis and fractures, ask your health care provider if you should be screened.  Ask your health care provider whether you should take a calcium or vitamin D supplement to lower your risk for osteoporosis.  Menopause may have certain physical symptoms and risks.  Hormone replacement therapy may reduce some of these symptoms and risks. Talk to your health  care provider about whether hormone replacement therapy is right for you. Follow these instructions at home:  Schedule regular health, dental, and eye exams.  Stay current with your immunizations.  Do not use any tobacco products including cigarettes, chewing tobacco, or electronic cigarettes.  If you are pregnant, do not drink alcohol.  If you are breastfeeding, limit how much and how often you drink alcohol.  Limit alcohol intake to no more than 1 drink per day for nonpregnant women. One drink equals 12 ounces of beer, 5 ounces of wine, or 1 ounces of hard liquor.  Do not use street drugs.  Do not share needles.  Ask your health care provider for help if you need support or information about quitting drugs.  Tell your health care provider if you often feel depressed.  Tell your health care provider if you have ever been abused or do not feel safe at home. This information is not intended to replace advice given to you by your health care provider. Make sure you discuss any questions you have with your health care provider. Document Released: 05/26/2011 Document Revised: 04/17/2016 Document Reviewed: 08/14/2015 Elsevier Interactive Patient Education  [employer].

## 2018-07-13 ENCOUNTER — Encounter: Payer: Self-pay | Admitting: Internal Medicine

## 2018-07-13 ENCOUNTER — Other Ambulatory Visit (INDEPENDENT_AMBULATORY_CARE_PROVIDER_SITE_OTHER): Payer: BLUE CROSS/BLUE SHIELD

## 2018-07-13 ENCOUNTER — Ambulatory Visit (INDEPENDENT_AMBULATORY_CARE_PROVIDER_SITE_OTHER): Payer: BLUE CROSS/BLUE SHIELD | Admitting: Internal Medicine

## 2018-07-13 VITALS — BP 114/76 | HR 97 | Temp 98.5°F | Resp 16 | Ht 64.0 in | Wt 133.0 lb

## 2018-07-13 DIAGNOSIS — F419 Anxiety disorder, unspecified: Secondary | ICD-10-CM

## 2018-07-13 DIAGNOSIS — Z0000 Encounter for general adult medical examination without abnormal findings: Secondary | ICD-10-CM

## 2018-07-13 DIAGNOSIS — G43109 Migraine with aura, not intractable, without status migrainosus: Secondary | ICD-10-CM | POA: Diagnosis not present

## 2018-07-13 LAB — COMPREHENSIVE METABOLIC PANEL
ALBUMIN: 4.4 g/dL (ref 3.5–5.2)
ALT: 17 U/L (ref 0–35)
AST: 18 U/L (ref 0–37)
Alkaline Phosphatase: 46 U/L (ref 39–117)
BUN: 21 mg/dL (ref 6–23)
CALCIUM: 9.6 mg/dL (ref 8.4–10.5)
CHLORIDE: 104 meq/L (ref 96–112)
CO2: 28 meq/L (ref 19–32)
Creatinine, Ser: 0.79 mg/dL (ref 0.40–1.20)
GFR: 86.61 mL/min (ref >60.00)
Glucose, Bld: 86 mg/dL (ref 70–99)
POTASSIUM: 4.3 meq/L (ref 3.5–5.1)
Sodium: 138 mEq/L (ref 135–145)
Total Bilirubin: 0.6 mg/dL (ref 0.2–1.2)
Total Protein: 7.3 g/dL (ref 6.0–8.3)

## 2018-07-13 LAB — LIPID PANEL
CHOL/HDL RATIO: 4
CHOLESTEROL: 180 mg/dL (ref 0–200)
HDL: 47.8 mg/dL (ref >39.00)
LDL CALC: 111 mg/dL — AB (ref 0–99)
NonHDL: 132.66
TRIGLYCERIDES: 110 mg/dL (ref 0.0–149.0)
VLDL: 22 mg/dL (ref 0.0–40.0)

## 2018-07-13 LAB — CBC WITH DIFFERENTIAL/PLATELET
BASOS PCT: 0.6 % (ref 0.0–3.0)
Basophils Absolute: 0 10*3/uL (ref 0.0–0.1)
EOS PCT: 0.9 % (ref 0.0–5.0)
Eosinophils Absolute: 0 10*3/uL (ref 0.0–0.7)
HEMATOCRIT: 38.3 % (ref 36.0–46.0)
HEMOGLOBIN: 13.1 g/dL (ref 12.0–15.0)
LYMPHS PCT: 33.4 % (ref 12.0–46.0)
Lymphs Abs: 1.4 10*3/uL (ref 0.7–4.0)
MCHC: 34.1 g/dL (ref 30.0–36.0)
MCV: 96 fl (ref 78.0–100.0)
Monocytes Absolute: 0.4 10*3/uL (ref 0.1–1.0)
Monocytes Relative: 9.6 % (ref 3.0–12.0)
Neutro Abs: 2.3 10*3/uL (ref 1.4–7.7)
Neutrophils Relative %: 55.5 % (ref 43.0–77.0)
Platelets: 228 10*3/uL (ref 150.0–400.0)
RBC: 3.99 Mil/uL (ref 3.87–5.11)
RDW: 11.5 % (ref 11.5–15.5)
WBC: 4.1 10*3/uL (ref 4.0–10.5)

## 2018-07-13 LAB — TSH: TSH: 1.28 u[IU]/mL (ref 0.35–4.50)

## 2018-07-13 MED ORDER — ALPRAZOLAM 0.25 MG PO TABS: 0.2500 mg | ORAL_TABLET | Freq: Three times a day (TID) | ORAL | 2 refills | Status: DC | PRN

## 2018-07-13 MED FILL — ALPRAZolam 0.25 MG TABS: 0.25 | 30 days supply | Qty: 30 | Fill #0

## 2018-07-13 NOTE — Assessment & Plan Note (Addendum)
She may go a couple of weeks w/o a migraine or can get several in a couple of weeks Overall she feels they are tolerable and controlled enough -just needs to catch them early Discussed other options if they are not manageable Continue current regimen

## 2018-07-13 NOTE — Assessment & Plan Note (Signed)
Taking xanax only as needed Works well, no side effects Effective  Will continue

## 2018-07-15 ENCOUNTER — Encounter: Payer: Self-pay | Admitting: Internal Medicine

## 2018-07-16 MED FILL — RIZATRIPTAN 10 MG ODT: 10 | 30 days supply | Qty: 10 | Fill #1

## 2018-07-19 MED FILL — AMITRIPTYLINE HCL 25 MG TAB: 25 | 30 days supply | Qty: 30 | Fill #3

## 2018-08-16 MED FILL — AMITRIPTYLINE HCL 25 MG TAB: 25 | 30 days supply | Qty: 30 | Fill #4

## 2018-09-20 MED FILL — ALPRAZolam 0.25 MG TABS: 0.25 | 10 days supply | Qty: 30 | Fill #1

## 2018-09-20 MED FILL — RIZATRIPTAN 10 MG ODT: 10 | 30 days supply | Qty: 10 | Fill #2

## 2018-09-20 MED FILL — AMITRIPTYLINE HCL 25 MG TAB: 25 | 30 days supply | Qty: 30 | Fill #5

## 2018-10-07 DIAGNOSIS — Z124 Encounter for screening for malignant neoplasm of cervix: Secondary | ICD-10-CM | POA: Diagnosis not present

## 2018-10-07 DIAGNOSIS — Z01419 Encounter for gynecological examination (general) (routine) without abnormal findings: Secondary | ICD-10-CM | POA: Diagnosis not present

## 2018-10-07 DIAGNOSIS — Z6822 Body mass index (BMI) 22.0-22.9, adult: Secondary | ICD-10-CM | POA: Diagnosis not present

## 2018-10-19 ENCOUNTER — Other Ambulatory Visit: Payer: Self-pay | Admitting: Internal Medicine

## 2018-10-19 MED FILL — AMITRIPTYLINE HCL 25 MG TAB: 25 | 30 days supply | Qty: 30 | Fill #0

## 2018-11-25 MED FILL — AMITRIPTYLINE HCL 25 MG TAB: 25 | 30 days supply | Qty: 30 | Fill #1

## 2018-12-27 MED FILL — ALPRAZolam 0.25 MG TABS: 0.25 | 10 days supply | Qty: 30 | Fill #2

## 2018-12-27 MED FILL — AMITRIPTYLINE HCL 25 MG TAB: 25 | 30 days supply | Qty: 30 | Fill #2

## 2019-01-07 MED FILL — RIZATRIPTAN 10 MG ODT: 10 | 30 days supply | Qty: 10 | Fill #3

## 2019-01-24 MED FILL — AMITRIPTYLINE HCL 25 MG TAB: 25 | 30 days supply | Qty: 30 | Fill #3

## 2019-01-31 ENCOUNTER — Encounter: Payer: Self-pay | Admitting: Internal Medicine

## 2019-01-31 MED ORDER — OSELTAMIVIR PHOSPHATE 75 MG PO CAPS: 75.0000 mg | ORAL_CAPSULE | Freq: Every day | ORAL | 0 refills | Status: DC

## 2019-01-31 MED FILL — OSELTAMIVIR PHOSPHATE 75 MG: 75 | 10 days supply | Qty: 10 | Fill #0

## 2019-02-22 MED FILL — AMITRIPTYLINE HCL 25 MG TAB: 25 | 30 days supply | Qty: 30 | Fill #4

## 2019-03-21 MED FILL — AMITRIPTYLINE HCL 25 MG TAB: 25 | 30 days supply | Qty: 30 | Fill #5

## 2019-04-20 ENCOUNTER — Other Ambulatory Visit: Payer: Self-pay

## 2019-04-20 MED ORDER — AMITRIPTYLINE HCL 25 MG PO TABS: 25.0000 mg | ORAL_TABLET | Freq: Every day | ORAL | 2 refills | Status: DC

## 2019-04-20 MED FILL — AMITRIPTYLINE HCL 25 MG TAB: 25 | 30 days supply | Qty: 30 | Fill #0

## 2019-04-23 ENCOUNTER — Other Ambulatory Visit: Payer: Self-pay | Admitting: Internal Medicine

## 2019-05-14 MED FILL — AMITRIPTYLINE HCL 25 MG TAB: 25 | 30 days supply | Qty: 30 | Fill #1

## 2019-06-13 MED FILL — AMITRIPTYLINE HCL 25 MG TAB: 25 | 30 days supply | Qty: 30 | Fill #2

## 2019-06-23 ENCOUNTER — Other Ambulatory Visit: Payer: Self-pay | Admitting: Internal Medicine

## 2019-06-23 DIAGNOSIS — Z0000 Encounter for general adult medical examination without abnormal findings: Secondary | ICD-10-CM

## 2019-07-13 ENCOUNTER — Other Ambulatory Visit: Payer: Self-pay

## 2019-07-13 MED ORDER — AMITRIPTYLINE HCL 25 MG PO TABS: 25.0000 mg | ORAL_TABLET | Freq: Every day | ORAL | 0 refills | Status: DC

## 2019-07-13 MED FILL — AMITRIPTYLINE HCL 25 MG TAB: 25 | 30 days supply | Qty: 30 | Fill #0

## 2019-07-22 ENCOUNTER — Other Ambulatory Visit (INDEPENDENT_AMBULATORY_CARE_PROVIDER_SITE_OTHER): Payer: BC Managed Care – PPO

## 2019-07-22 DIAGNOSIS — Z0000 Encounter for general adult medical examination without abnormal findings: Secondary | ICD-10-CM | POA: Diagnosis not present

## 2019-07-22 LAB — CBC WITH DIFFERENTIAL/PLATELET
Basophils Absolute: 0 10*3/uL (ref 0.0–0.1)
Basophils Relative: 0.4 % (ref 0.0–3.0)
Eosinophils Absolute: 0.1 10*3/uL (ref 0.0–0.7)
Eosinophils Relative: 1.5 % (ref 0.0–5.0)
HCT: 37.7 % (ref 36.0–46.0)
Hemoglobin: 12.9 g/dL (ref 12.0–15.0)
Lymphocytes Relative: 22.9 % (ref 12.0–46.0)
Lymphs Abs: 1.3 10*3/uL (ref 0.7–4.0)
MCHC: 34.3 g/dL (ref 30.0–36.0)
MCV: 95.4 fl (ref 78.0–100.0)
Monocytes Absolute: 1 10*3/uL (ref 0.1–1.0)
Monocytes Relative: 16.7 % — ABNORMAL HIGH (ref 3.0–12.0)
Neutro Abs: 3.4 10*3/uL (ref 1.4–7.7)
Neutrophils Relative %: 58.5 % (ref 43.0–77.0)
Platelets: 208 10*3/uL (ref 150.0–400.0)
RBC: 3.95 Mil/uL (ref 3.87–5.11)
RDW: 11.6 % (ref 11.5–15.5)
WBC: 5.9 10*3/uL (ref 4.0–10.5)

## 2019-07-22 LAB — COMPREHENSIVE METABOLIC PANEL
ALT: 10 U/L (ref 0–35)
AST: 12 U/L (ref 0–37)
Albumin: 4.5 g/dL (ref 3.5–5.2)
Alkaline Phosphatase: 42 U/L (ref 39–117)
BUN: 15 mg/dL (ref 6–23)
CO2: 24 mEq/L (ref 19–32)
Calcium: 9.4 mg/dL (ref 8.4–10.5)
Chloride: 106 mEq/L (ref 96–112)
Creatinine, Ser: 0.72 mg/dL (ref 0.40–1.20)
GFR: 90.2 mL/min (ref >60.00)
Glucose, Bld: 87 mg/dL (ref 70–99)
Potassium: 4.2 mEq/L (ref 3.5–5.1)
Sodium: 139 mEq/L (ref 135–145)
Total Bilirubin: 0.4 mg/dL (ref 0.2–1.2)
Total Protein: 7.4 g/dL (ref 6.0–8.3)

## 2019-07-22 LAB — LIPID PANEL
Cholesterol: 181 mg/dL (ref 0–200)
HDL: 45 mg/dL (ref >39.00)
LDL Cholesterol: 119 mg/dL — ABNORMAL HIGH (ref 0–99)
NonHDL: 135.79
Total CHOL/HDL Ratio: 4
Triglycerides: 85 mg/dL (ref 0.0–149.0)
VLDL: 17 mg/dL (ref 0.0–40.0)

## 2019-07-22 LAB — TSH: TSH: 1.62 u[IU]/mL (ref 0.35–4.50)

## 2019-07-23 ENCOUNTER — Encounter: Payer: Self-pay | Admitting: Internal Medicine

## 2019-07-23 NOTE — Patient Instructions (Addendum)
We reviewed your blood work.   All other Health Maintenance issues reviewed.   All recommended immunizations and age-appropriate screenings are up-to-date or discussed.  No immunization administered today.   Medications reviewed and updated.  Changes include :   none  Your prescription(s) have been submitted to your pharmacy. Please take as directed and contact our office if you believe you are having problem(s) with the medication(s).  A referral was ordered for cardiology - EPS.   Please followup in 1 year    Health Maintenance, Female Adopting a healthy lifestyle and getting preventive care are important in promoting health and wellness. Ask your health care provider about:  The right schedule for you to have regular tests and exams.  Things you can do on your own to prevent diseases and keep yourself healthy. What should I know about diet, weight, and exercise? Eat a healthy diet   Eat a diet that includes plenty of vegetables, fruits, low-fat dairy products, and lean protein.  Do not eat a lot of foods that are high in solid fats, added sugars, or sodium. Maintain a healthy weight Body mass index (BMI) is used to identify weight problems. It estimates body fat based on height and weight. Your health care provider can help determine your BMI and help you achieve or maintain a healthy weight. Get regular exercise Get regular exercise. This is one of the most important things you can do for your health. Most adults should:  Exercise for at least 150 minutes each week. The exercise should increase your heart rate and make you sweat (moderate-intensity exercise).  Do strengthening exercises at least twice a week. This is in addition to the moderate-intensity exercise.  Spend less time sitting. Even light physical activity can be beneficial. Watch cholesterol and blood lipids Have your blood tested for lipids and cholesterol at 39 years of age, then have this test every 5  years. Have your cholesterol levels checked more often if:  Your lipid or cholesterol levels are high.  You are older than 39 years of age.  You are at high risk for heart disease. What should I know about cancer screening? Depending on your health history and family history, you may need to have cancer screening at various ages. This may include screening for:  Breast cancer.  Cervical cancer.  Colorectal cancer.  Skin cancer.  Lung cancer. What should I know about heart disease, diabetes, and high blood pressure? Blood pressure and heart disease  High blood pressure causes heart disease and increases the risk of stroke. This is more likely to develop in people who have high blood pressure readings, are of African descent, or are overweight.  Have your blood pressure checked: ? Every 3-5 years if you are 9-58 years of age. ? Every year if you are 7 years old or older. Diabetes Have regular diabetes screenings. This checks your fasting blood sugar level. Have the screening done:  Once every three years after age 82 if you are at a normal weight and have a low risk for diabetes.  More often and at a younger age if you are overweight or have a high risk for diabetes. What should I know about preventing infection? Hepatitis B If you have a higher risk for hepatitis B, you should be screened for this virus. Talk with your health care provider to find out if you are at risk for hepatitis B infection. Hepatitis C Testing is recommended for:  Everyone born from [birth year] through 1966.  Anyone with known risk factors for hepatitis C. Sexually transmitted infections (STIs)  Get screened for STIs, including gonorrhea and chlamydia, if: ? You are sexually active and are younger than 39 years of age. ? You are older than 39 years of age and your health care provider tells you that you are at risk for this type of infection. ? Your sexual activity has changed since you were last  screened, and you are at increased risk for chlamydia or gonorrhea. Ask your health care provider if you are at risk.  Ask your health care provider about whether you are at high risk for HIV. Your health care provider may recommend a prescription medicine to help prevent HIV infection. If you choose to take medicine to prevent HIV, you should first get tested for HIV. You should then be tested every 3 months for as long as you are taking the medicine. Pregnancy  If you are about to stop having your period (premenopausal) and you may become pregnant, seek counseling before you get pregnant.  Take 400 to 800 micrograms (mcg) of folic acid every day if you become pregnant.  Ask for birth control (contraception) if you want to prevent pregnancy. Osteoporosis and menopause Osteoporosis is a disease in which the bones lose minerals and strength with aging. This can result in bone fractures. If you are 39 years old or older, or if you are at risk for osteoporosis and fractures, ask your health care provider if you should:  Be screened for bone loss.  Take a calcium or vitamin D supplement to lower your risk of fractures.  Be given hormone replacement therapy (HRT) to treat symptoms of menopause. Follow these instructions at home: Lifestyle  Do not use any products that contain nicotine or tobacco, such as cigarettes, e-cigarettes, and chewing tobacco. If you need help quitting, ask your health care provider.  Do not use street drugs.  Do not share needles.  Ask your health care provider for help if you need support or information about quitting drugs. Alcohol use  Do not drink alcohol if: ? Your health care provider tells you not to drink. ? You are pregnant, may be pregnant, or are planning to become pregnant.  If you drink alcohol: ? Limit how much you use to 0-1 drink a day. ? Limit intake if you are breastfeeding.  Be aware of how much alcohol is in your drink. In the [place] one  drink equals one 12 oz bottle of beer (355 mL), one 5 oz glass of wine (148 mL), or one 1 oz glass of hard liquor (44 mL). General instructions  Schedule regular health, dental, and eye exams.  Stay current with your vaccines.  Tell your health care provider if: ? You often feel depressed. ? You have ever been abused or do not feel safe at home. Summary  Adopting a healthy lifestyle and getting preventive care are important in promoting health and wellness.  Follow your health care provider's instructions about healthy diet, exercising, and getting tested or screened for diseases.  Follow your health care provider's instructions on monitoring your cholesterol and blood pressure. This information is not intended to replace advice given to you by your health care provider. Make sure you discuss any questions you have with your health care provider. Document Released: 05/26/2011 Document Revised: 11/03/2018 Document Reviewed: 11/03/2018 Elsevier Patient Education  2020 [employer]

## 2019-07-23 NOTE — Progress Notes (Signed)
Subjective:    Patient ID: Summer Anderson, female    DOB: [birth year], 38 y.o.   MRN: [id number]  HPI She is here for a physical exam.    The second week in July she started noticing SVTs - she has had them in the past.  She feels the palpitaitons and feel like she has something in her thorat.  Her HR will be in the 140's at times.  She had an attempted ablation 15 years ago that was not successful. She has continued to have them, but the past 6 weeks it has been increased.  It makes her tired and not feel good.   She has the palpitations daily.  No change in caffeine, migraines unchanged, no increase in otc meds.  Maybe stress level up a little, but now back to normal.   Her right knee sounds like rice krispy treats and feels like it.  It will hurt by the end of the day after running a few days or  going up and down the stairs in her house.  She stopped running.  She really noticed it the past month.  She is walking and now only feels it when going up and down the stairs.  Ganglion cyst in right wrist - sometimes bigger than usual and will cause numbness in fingers.    Medications and allergies reviewed with patient and updated if appropriate.  Patient Active Problem List   Diagnosis Date Noted  . Abnormal breast biopsy 06/13/2016  . Anxiety 06/13/2016  . Patella-femoral syndrome 06/11/2015  . Migraines     Current Outpatient Medications on File Prior to Visit  Medication Sig Dispense Refill  . ALPRAZolam (XANAX) 0.25 MG tablet Take 1 tablet (0.25 mg total) by mouth 3 (three) times daily as needed. 30 tablet 2  . amitriptyline (ELAVIL) 25 MG tablet Take 1 tablet (25 mg total) by mouth at bedtime. 90 tablet 0  . aspirin-acetaminophen-caffeine (EXCEDRIN MIGRAINE) 250-250-65 MG tablet Take 1 tablet by mouth every 6 (six) hours as needed for headache. 30 tablet 0  . Biotin 78295 MCG TABS Take by mouth daily.    . Cholecalciferol (VITAMIN D-3) 5000 units TABS Take 5,000 Units by mouth  daily.    Marland Kitchen levonorgestrel (MIRENA) 20 MCG/24HR IUD 1 Intra Uterine Device (1 each total) by Intrauterine route once. 1 each 0  . rizatriptan (MAXALT-MLT) 10 MG disintegrating tablet Take 1 tablet (10 mg total) by mouth as needed for migraine. May repeat in 2 hours if needed 10 tablet 11   No current facility-administered medications on file prior to visit.     Past Medical History:  Diagnosis Date  . Allergy   . Chronic headaches   . Depression    hx of, no medications since 12/15  . Kidney stones 2004  . Migraines     Past Surgical History:  Procedure Laterality Date  . BREAST BIOPSY Right    PSEUDOANGIOMATOUS STROMAL HYPERPLASIA (PASH).  Marland Kitchen BREAST ENHANCEMENT SURGERY  12/2013    Social History   Socioeconomic History  . Marital status: Married    Spouse name: Not on file  . Number of children: 2  . Years of education: Not on file  . Highest education level: Not on file  Occupational History  . Not on file  Social Needs  . Financial resource strain: Not on file  . Food insecurity    Worry: Not on file    Inability: Not on file  . Transportation needs  Medical: Not on file    Non-medical: Not on file  Tobacco Use  . Smoking status: Never Smoker  . Smokeless tobacco: Never Used  Substance and Sexual Activity  . Alcohol use: Yes    Alcohol/week: 0.0 standard drinks    Comment: Occasioanally  . Drug use: No  . Sexual activity: Not on file  Lifestyle  . Physical activity    Days per week: Not on file    Minutes per session: Not on file  . Stress: Not on file  Relationships  . Social Musicianconnections    Talks on phone: Not on file    Gets together: Not on file    Attends religious service: Not on file    Active member of club or organization: Not on file    Attends meetings of clubs or organizations: Not on file    Relationship status: Not on file  Other Topics Concern  . 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   She lives with husband and two daughters.    Exercise:  running       Family History  Problem Relation Age of Onset  . Acute myelogenous leukemia Father 2358  . Healthy Mother   . Parkinson's disease Maternal Grandfather        uncetain dx  . Healthy Brother   . Healthy Sister   . Healthy Daughter     Review of Systems  Constitutional: Negative for chills and fever.  Eyes: Negative for visual disturbance.  Respiratory: Negative for cough, shortness of breath and wheezing.   Cardiovascular: Positive for palpitations. Negative for chest pain and leg swelling.  Gastrointestinal: Negative for abdominal pain, blood in stool, constipation, diarrhea and nausea.       No gred  Genitourinary: Negative for dysuria and hematuria.  Musculoskeletal: Positive for arthralgias. Negative for back pain and myalgias.  Skin: Negative for color change and rash.  Neurological: Positive for headaches. Negative for dizziness, light-headedness and numbness.  Psychiatric/Behavioral: Negative for dysphoric mood. The patient is nervous/anxious (controllable ).        Objective:   Vitals:   07/25/19 0841  BP: 116/78  Pulse: 96  Resp: 16  Temp: 98.6 F (37 C)  SpO2: 99%   Filed Weights   07/25/19 0841  Weight: 136 lb (61.7 kg)   Body mass index is 23.34 kg/m.  BP Readings from Last 3 Encounters:  07/25/19 116/78  07/13/18 114/76  01/28/18 116/68    Wt Readings from Last 3 Encounters:  07/25/19 136 lb (61.7 kg)  07/13/18 133 lb (60.3 kg)  01/28/18 132 lb (59.9 kg)     Physical Exam Constitutional: She appears well-developed and well-nourished. No distress.  HENT:  Head: Normocephalic and atraumatic.  Right Ear: External ear normal. Normal ear canal and TM Left Ear: External ear normal.  Normal ear canal and TM Mouth/Throat: Oropharynx is clear and moist.  Eyes: Conjunctivae and EOM are normal.  Neck: Neck supple. No tracheal deviation present. No thyromegaly  present.  No carotid bruit  Cardiovascular: Normal rate, regular rhythm and normal heart sounds.  No murmur heard.  No edema. Pulmonary/Chest: Effort normal and breath sounds normal. No respiratory distress. She has no wheezes. She has no rales.  Breast: deferred   Abdominal: Soft. She exhibits no distension. There is no tenderness.  Lymphadenopathy: She has no cervical adenopathy.  Skin: Skin is warm and dry. She  is not diaphoretic.  Psychiatric: She has a normal mood and affect. Her behavior is normal.        Assessment & Plan:   Physical exam: Screening blood work reviewed Immunizations  Flu done at work, tdap up to date Gyn   Up to date  Exercise  Walking.  Weight   Normal BMI Skin   No concerns Substance abuse   none  See Problem List for Assessment and Plan of chronic medical problems.    FU in one year

## 2019-07-25 ENCOUNTER — Encounter: Payer: Self-pay | Admitting: Internal Medicine

## 2019-07-25 ENCOUNTER — Ambulatory Visit (INDEPENDENT_AMBULATORY_CARE_PROVIDER_SITE_OTHER): Payer: BC Managed Care – PPO | Admitting: Internal Medicine

## 2019-07-25 ENCOUNTER — Other Ambulatory Visit: Payer: Self-pay

## 2019-07-25 VITALS — BP 116/78 | HR 96 | Temp 98.6°F | Resp 16 | Ht 64.0 in | Wt 136.0 lb

## 2019-07-25 DIAGNOSIS — R000 Tachycardia, unspecified: Secondary | ICD-10-CM

## 2019-07-25 DIAGNOSIS — I471 Supraventricular tachycardia, unspecified: Secondary | ICD-10-CM | POA: Insufficient documentation

## 2019-07-25 DIAGNOSIS — F419 Anxiety disorder, unspecified: Secondary | ICD-10-CM

## 2019-07-25 DIAGNOSIS — M25561 Pain in right knee: Secondary | ICD-10-CM

## 2019-07-25 DIAGNOSIS — G43109 Migraine with aura, not intractable, without status migrainosus: Secondary | ICD-10-CM | POA: Diagnosis not present

## 2019-07-25 DIAGNOSIS — Z0000 Encounter for general adult medical examination without abnormal findings: Secondary | ICD-10-CM

## 2019-07-25 HISTORY — DX: Tachycardia, unspecified: R00.0

## 2019-07-25 HISTORY — DX: Pain in right knee: M25.561

## 2019-07-25 NOTE — Assessment & Plan Note (Signed)
At baseline Continue xanax as needed

## 2019-07-25 NOTE — Assessment & Plan Note (Addendum)
She has 1-2 migraines a week She takes Excedrin migraine and often can avoid the maxalt continue amitriptyline nightly

## 2019-07-25 NOTE — Assessment & Plan Note (Signed)
H/o patella femoral syndrome   Will refer to sports med

## 2019-07-25 NOTE — Assessment & Plan Note (Signed)
Has had SVT for years - attempted ablation in [place] about 15 yrs ago was not successful - since then it was occasional but the past 6 weeks having daily SVT with fatigue and not feeling well HR into the 140's Exam today normal Blood work normal - no obvious cause Will refer to EPS

## 2019-08-03 MED FILL — AMITRIPTYLINE HCL 25 MG TAB: 25 | 30 days supply | Qty: 30 | Fill #0

## 2019-08-04 ENCOUNTER — Encounter: Payer: Self-pay | Admitting: Internal Medicine

## 2019-08-04 NOTE — Telephone Encounter (Signed)
Pt is now scheduled.

## 2019-08-10 ENCOUNTER — Telehealth: Payer: Self-pay

## 2019-08-12 NOTE — Telephone Encounter (Signed)
Spoke with pt regarding appt on 08/15/19. Pt stated she will check her vitals and did not have any questions at this time.

## 2019-08-15 ENCOUNTER — Telehealth: Payer: Self-pay

## 2019-08-15 ENCOUNTER — Encounter: Payer: Self-pay | Admitting: Internal Medicine

## 2019-08-15 ENCOUNTER — Telehealth: Payer: Self-pay | Admitting: *Deleted

## 2019-08-15 ENCOUNTER — Telehealth (INDEPENDENT_AMBULATORY_CARE_PROVIDER_SITE_OTHER): Payer: BC Managed Care – PPO | Admitting: Internal Medicine

## 2019-08-15 VITALS — Ht 64.0 in | Wt 135.0 lb

## 2019-08-15 DIAGNOSIS — I471 Supraventricular tachycardia: Secondary | ICD-10-CM

## 2019-08-15 DIAGNOSIS — I479 Paroxysmal tachycardia, unspecified: Secondary | ICD-10-CM | POA: Diagnosis not present

## 2019-08-15 NOTE — Telephone Encounter (Signed)
-----   Message from Thompson Grayer, MD sent at 08/15/2019  9:03 AM EDT ----- 30 day event monitor to evaluate for SVT  Follow-up with me in 7 weeks

## 2019-08-15 NOTE — Progress Notes (Signed)
Electrophysiology TeleHealth Note   Due to national recommendations of social distancing due to COVID 19, Audio/video telehealth visit is felt to be most appropriate for this patient at this time.  See MyChart message from today for patient consent regarding telehealth for [facility].   Date:  08/15/2019   ID:  Summer Sofiaawn Buckwalter, DOB [birth year], MRN [id number]  Location: home Provider location: [place] [facility] Evaluation Performed: New patient consult  PCP:  Pincus SanesBurns, Stacy J, MD   Chief Complaint:  palpitations  History of Present Illness:    Summer Anderson is a 38 y.o. female who presents via audio/video conferencing for a telehealth visit today.   The patient is referred for new consultation regarding SVT by Dr Lawerance BachBurns.   She reports having "SVT for years".  She had attempted ablation in [place] 15 years ago.  She says that she had no inducible arrhythmias. She has had increasing SVT over the past 2 months.  Episodes occur daily, lasting up to several hours.  Episodes are gradual in onset/ offset. She reports heart rates into the 140s bpm.  She has had some fatigue with episodes. She reports feeling like her heart is beating "really fast and into her neck".  She is not aware of triggers/ precipitants.  She has reduced caffeine intake.  She has not tried vagal maneuvers but has typically not been able to terminate episodes.  She has not required adenosine previously.  She does not recall having this captured on monitor or ekg previously.  Today, she denies symptoms of palpitations, chest pain, shortness of breath, orthopnea, PND, lower extremity edema, claudication, dizziness, presyncope, syncope, bleeding, or neurologic sequela. The patient is tolerating medications without difficulties and is otherwise without complaint today.   she denies symptoms of cough, fevers, chills, or new SOB worrisome for COVID 19.   Past Medical History:  Diagnosis Date  . Abnormal breast biopsy 06/13/2016   Right, PSEUDOANGIOMATOUS STROMAL HYPERPLASIA (PASH). 6 month f/u   . Acute bronchitis 09/18/2017  . Allergy   . Anxiety 06/13/2016  . Chronic headaches   . Depression    hx of, no medications since 12/15  . Kidney stones 2004  . Migraines   . Migraines    Started in HS [place], nausea MRI normal Did not tolerate topamax in the past Excedrin migraine as needed No known triggers, can have a few a week or none for a couple of weeks   . Patella-femoral syndrome 06/11/2015  . Rash 01/28/2018  . Right knee pain 07/25/2019  . Sore throat 01/28/2018  . SVT (supraventricular tachycardia) (HCC) 07/25/2019  . Weight gain 09/22/2014    Past Surgical History:  Procedure Laterality Date  . BREAST BIOPSY Right    PSEUDOANGIOMATOUS STROMAL HYPERPLASIA (PASH).  Marland Kitchen. BREAST ENHANCEMENT SURGERY  12/2013    Current Outpatient Medications  Medication Sig Dispense Refill  . ALPRAZolam (XANAX) 0.25 MG tablet Take 1 tablet (0.25 mg total) by mouth 3 (three) times daily as needed. 30 tablet 2  . amitriptyline (ELAVIL) 25 MG tablet Take 1 tablet (25 mg total) by mouth at bedtime. 90 tablet 0  . aspirin-acetaminophen-caffeine (EXCEDRIN MIGRAINE) 250-250-65 MG tablet Take 1 tablet by mouth every 6 (six) hours as needed for headache. 30 tablet 0  . Biotin 4098110000 MCG TABS Take by mouth daily.    . Cholecalciferol (VITAMIN D-3) 5000 units TABS Take 5,000 Units by mouth daily.    Marland Kitchen. levonorgestrel (MIRENA) 20 MCG/24HR IUD 1 Intra Uterine Device (1 each  total) by Intrauterine route once. 1 each 0  . rizatriptan (MAXALT-MLT) 10 MG disintegrating tablet Take 1 tablet (10 mg total) by mouth as needed for migraine. May repeat in 2 hours if needed 10 tablet 11   No current facility-administered medications for this visit.     Allergies:   Oxycodone-acetaminophen   Social History:  The patient  reports that she has never smoked. She has never used smokeless tobacco. She reports current alcohol use. She reports that she does not  use drugs.   She works at the cancer center  Family History:  The patient's family history includes Acute myelogenous leukemia (age of onset: 62) in her father; Healthy in her brother, daughter, mother, and sister; Parkinson's disease in her maternal grandfather.    ROS:  Please see the history of present illness.   All other systems are personally reviewed and negative.    Exam:    Vital Signs:  Ht 5\' 4"  (1.626 m)   Wt 135 lb (61.2 kg)   BMI 23.17 kg/m    Well appearing, alert and conversant, regular work of breathing,  good skin color Eyes- anicteric, neuro- grossly intact, skin- no apparent rash or lesions or cyanosis, mouth- oral mucosa is pink   Labs/Other Tests and Data Reviewed:    Recent Labs: 07/22/2019: ALT 10; BUN 15; Creatinine, Ser 0.72; Hemoglobin 12.9; Platelets 208.0; Potassium 4.2; Sodium 139; TSH 1.62   Wt Readings from Last 3 Encounters:  08/15/19 135 lb (61.2 kg)  07/25/19 136 lb (61.7 kg)  07/13/18 133 lb (60.3 kg)     Other studies personally reviewed: Additional studies/ records that were reviewed today include: Dr Quay Burow' notes  Review of the above records today demonstrates: as above    ASSESSMENT & PLAN:    1.  Tachycardia Given gradual onset/offset and prior negative EPS, I am suspicious that this could be sinus tachycardia.  She has had some stress with her children having school virtually.  I do think that it is prudent to evaluate for SVT.  I would therefore advise 30 day monitor.  I have made no changes to her medicine today.  We did discuss regular exercise, stress reduction, adequate sleep and hydration as treatment in the interim.  Follow-up:  7 weeks with me  Current medicines are reviewed at length with the patient today.   The patient does not have concerns regarding her medicines.  The following changes were made today:  none  Labs/ tests ordered today include:  No orders of the defined types were placed in this encounter.   Patient  Risk:  after full review of this patients clinical status, I feel that they are at moderate risk at this time.   Today, I have spent 15 minutes with the patient with telehealth technology discussing tachycardia .    SignedThompson Grayer MD, [place] 08/15/2019 8:48 AM   [facility] [address] [place] [address] [phone] (office) [phone] (fax)

## 2019-08-15 NOTE — Telephone Encounter (Signed)
[employer] to ship a 30 day cardiac event monitor to the patients home.  Instructions reviewed briefly as they are included in the monitor kit. 

## 2019-08-16 ENCOUNTER — Ambulatory Visit (INDEPENDENT_AMBULATORY_CARE_PROVIDER_SITE_OTHER)
Admission: RE | Admit: 2019-08-16 | Discharge: 2019-08-16 | Disposition: A | Discharge status: Home / Self Care | Payer: BC Managed Care – PPO | Source: Ambulatory Visit | Attending: Family Medicine | Admitting: Family Medicine

## 2019-08-16 ENCOUNTER — Encounter: Payer: Self-pay | Admitting: Family Medicine

## 2019-08-16 ENCOUNTER — Ambulatory Visit (INDEPENDENT_AMBULATORY_CARE_PROVIDER_SITE_OTHER): Payer: BC Managed Care – PPO | Admitting: Family Medicine

## 2019-08-16 ENCOUNTER — Ambulatory Visit: Payer: Self-pay

## 2019-08-16 ENCOUNTER — Other Ambulatory Visit: Payer: Self-pay

## 2019-08-16 VITALS — BP 112/68 | HR 99 | Ht 64.0 in | Wt 137.0 lb

## 2019-08-16 DIAGNOSIS — M25561 Pain in right knee: Secondary | ICD-10-CM

## 2019-08-16 DIAGNOSIS — G8929 Other chronic pain: Secondary | ICD-10-CM

## 2019-08-16 DIAGNOSIS — M222X1 Patellofemoral disorders, right knee: Secondary | ICD-10-CM | POA: Diagnosis not present

## 2019-08-16 MED ORDER — PENNSAID 2 % TD SOLN: 2.0000 g | Freq: Two times a day (BID) | TRANSDERMAL | 3 refills | Status: DC

## 2019-08-16 NOTE — Patient Instructions (Addendum)
Good to see you  Xray downstairs Exercise 3 times a week Turmeric 500mg  daily  pennsaid pinkie amount topically 2 times daily as needed. Run up to 3 times a week See me again in 6 weeks

## 2019-08-16 NOTE — Assessment & Plan Note (Signed)
Patellofemoral.  Likely has some mild underlying arthritis pain topical anti-inflammatories given, hydrocodone, work with Product/process development scientist, Tru pull lite brace given, patient will try conservative therapy for the next 6 weeks and follow-up at that time.  Worsening pain consider injection and formal physical therapy.

## 2019-08-16 NOTE — Progress Notes (Signed)
[name] [address] [place], [place] [address] Phone: [phone] Subjective:   I Ronelle Nigh am serving as a Neurosurgeon for Dr. Antoine Primas.  I'm seeing this patient by the request  of:  Pincus Sanes, MD  CC: Right knee pain  YDX:AJOINOMVEH  Summer Anderson is a 38 y.o. female coming in with complaint of right knee pain. Patient states that she hears crunchy sound in the knee. Injured it in 10th grade while participating in gymnastics. States she ran into the vault table.   Onset- Chronic  Location - patellar tendon  Duration-  Character- achy  Aggravating factors- stairs (up and down), running, lunges, burpees  Reliving factors-  Therapies tried- ice, motrin  Severity-  5/10     Past Medical History:  Diagnosis Date  . Abnormal breast biopsy 06/13/2016   Right, PSEUDOANGIOMATOUS STROMAL HYPERPLASIA (PASH). 6 month f/u   . Acute bronchitis 09/18/2017  . Allergy   . Anxiety 06/13/2016  . Chronic headaches   . Depression    hx of, no medications since 12/15  . Kidney stones 2004  . Migraines    Started in HS Aura, nausea MRI normal Did not tolerate topamax in the past Excedrin migraine as needed No known triggers, can have a few a week or none for a couple of weeks   . Patella-femoral syndrome 06/11/2015  . Rash 01/28/2018  . Right knee pain 07/25/2019  . Sore throat 01/28/2018  . SVT (supraventricular tachycardia) (HCC) 07/25/2019  . Weight gain 09/22/2014   Past Surgical History:  Procedure Laterality Date  . BREAST BIOPSY Right    PSEUDOANGIOMATOUS STROMAL HYPERPLASIA (PASH).  Marland Kitchen BREAST ENHANCEMENT SURGERY  12/2013  . EP study  2006   performed in [place],  no inducible arrhythmias per patient   Social History   Socioeconomic History  . Marital status: Married    Spouse name: Not on file  . Number of children: 2  . Years of education: Not on file  . Highest education level: Not on file  Occupational History  . Not on file  Social  Needs  . Financial resource strain: Not on file  . Food insecurity    Worry: Not on file    Inability: Not on file  . Transportation needs    Medical: Not on file    Non-medical: Not on file  Tobacco Use  . Smoking status: Never Smoker  . Smokeless tobacco: Never Used  Substance and Sexual Activity  . Alcohol use: Yes    Alcohol/week: 0.0 standard drinks    Comment: Occasioanally  . Drug use: No  . Sexual activity: Not on file  Lifestyle  . Physical activity    Days per week: Not on file    Minutes per session: Not on file  . Stress: Not on file  Relationships  . Social Musician on phone: Not on file    Gets together: Not on file    Attends religious service: Not on file    Active member of club or organization: Not on file    Attends meetings of clubs or organizations: Not on file    Relationship status: Not on file  Other Topics Concern  . 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   She lives with husband and two daughters.    Exercise:  running  Allergies  Allergen Reactions  . Oxycodone-Acetaminophen Nausea Only   Family History  Problem Relation Age of Onset  . Acute myelogenous leukemia Father 78  . Healthy Mother   . Parkinson's disease Maternal Grandfather        uncetain dx  . Healthy Brother   . Healthy Sister   . Healthy Daughter     Current Outpatient Medications (Endocrine & Metabolic):  .  levonorgestrel (MIRENA) 20 MCG/24HR IUD, 1 Intra Uterine Device (1 each total) by Intrauterine route once.    Current Outpatient Medications (Analgesics):  .  aspirin-acetaminophen-caffeine (EXCEDRIN MIGRAINE) 250-250-65 MG tablet, Take 1 tablet by mouth every 6 (six) hours as needed for headache. .  rizatriptan (MAXALT-MLT) 10 MG disintegrating tablet, Take 1 tablet (10 mg total) by mouth as needed for migraine. May repeat in 2 hours if needed   Current Outpatient Medications  (Other):  Marland Kitchen  ALPRAZolam (XANAX) 0.25 MG tablet, Take 1 tablet (0.25 mg total) by mouth 3 (three) times daily as needed. Marland Kitchen  amitriptyline (ELAVIL) 25 MG tablet, Take 1 tablet (25 mg total) by mouth at bedtime. .  Biotin 10000 MCG TABS, Take by mouth daily. .  Cholecalciferol (VITAMIN D-3) 5000 units TABS, Take 5,000 Units by mouth daily. .  Diclofenac Sodium (PENNSAID) 2 % SOLN, Place 2 g onto the skin 2 (two) times daily.    Past medical history, social, surgical and family history all reviewed in electronic medical record.  No pertanent information unless stated regarding to the chief complaint.   Review of Systems:  No headache, visual changes, nausea, vomiting, diarrhea, constipation, dizziness, abdominal pain, skin rash, fevers, chills, night sweats, weight loss, swollen lymph nodes, body aches, joint swelling, muscle aches, chest pain, shortness of breath, mood changes.   Objective  Blood pressure 112/68, pulse 99, height 5\' 4"  (1.626 m), weight 137 lb (62.1 kg), SpO2 99 %.   General: No apparent distress alert and oriented x3 mood and affect normal, dressed appropriately.  HEENT: Pupils equal, extraocular movements intact  Respiratory: Patient's speak in full sentences and does not appear short of breath  Cardiovascular: No lower extremity edema, non tender, no erythema  Skin: Warm dry intact with no signs of infection or rash on extremities or on axial skeleton.  Abdomen: Soft nontender  Neuro: Cranial nerves II through XII are intact, neurovascularly intact in all extremities with 2+ DTRs and 2+ pulses.  Lymph: No lymphadenopathy of posterior or anterior cervical chain or axillae bilaterally.  Gait normal with good balance and coordination.  MSK:  Non tender with full range of motion and good stability and symmetric strength and tone of shoulders, elbows, wrist, hip, and ankles bilaterally. Right knee exam shows the patient does have mild lateral tracking of the patella noted.   Patient does have mild positive patellar grind test.  Needle otherwise is stable with all collateral ligaments tested.  No pain over the patella tendon itself.  Limited musculoskeletal ultrasound was performed and interpreted by Lyndal Pulley  Limited ultrasound of patient's knee shows trace effusion of the patella joint.  No significant arthritic changes were noted.  Meniscus appear to be unremarkable.  No Baker's cyst appreciated. Impression: Mild patellofemoral syndrome   Impression and Recommendations:     This case required medical decision making of moderate complexity. The above documentation has been reviewed and is accurate and complete Lyndal Pulley, DO       Note: This dictation was prepared with Dragon dictation along with smaller  Company secretary. Any transcriptional errors that result from this process are unintentional.

## 2019-08-17 ENCOUNTER — Encounter: Payer: Self-pay | Admitting: Family Medicine

## 2019-08-18 ENCOUNTER — Ambulatory Visit: Payer: BC Managed Care – PPO | Admitting: Cardiovascular Disease

## 2019-08-23 ENCOUNTER — Ambulatory Visit (INDEPENDENT_AMBULATORY_CARE_PROVIDER_SITE_OTHER): Payer: BC Managed Care – PPO

## 2019-08-23 DIAGNOSIS — I471 Supraventricular tachycardia: Secondary | ICD-10-CM

## 2019-09-02 ENCOUNTER — Institutional Professional Consult (permissible substitution): Payer: BC Managed Care – PPO | Admitting: Internal Medicine

## 2019-09-08 ENCOUNTER — Encounter: Payer: Self-pay | Admitting: Internal Medicine

## 2019-09-13 MED ORDER — PAROXETINE HCL 10 MG PO TABS: 10.0000 mg | ORAL_TABLET | Freq: Every day | ORAL | 5 refills | Status: DC

## 2019-09-13 MED FILL — PARoxetine HCL 10 MG TABS: 10 | 30 days supply | Qty: 30 | Fill #0

## 2019-09-13 NOTE — Addendum Note (Signed)
Addended by: Binnie Rail on: 09/13/2019 07:35 AM   Modules accepted: Orders

## 2019-09-22 ENCOUNTER — Other Ambulatory Visit: Payer: Self-pay | Admitting: Internal Medicine

## 2019-09-22 MED FILL — RIZATRIPTAN 10 MG ODT: 10 | 30 days supply | Qty: 10 | Fill #0

## 2019-09-22 MED FILL — AMITRIPTYLINE HCL 25 MG TAB: 25 | 30 days supply | Qty: 30 | Fill #1

## 2019-09-23 ENCOUNTER — Other Ambulatory Visit: Payer: Self-pay

## 2019-09-23 MED ORDER — RIZATRIPTAN BENZOATE 10 MG PO TBDP: ORAL_TABLET | ORAL | 11 refills | Status: DC

## 2019-09-25 NOTE — Progress Notes (Signed)
[name] [address] [place], [place] [address] Phone: [phone] Subjective:   I Summer Anderson am serving as a Neurosurgeon for Dr. Antoine Primas.   CC: right knee pain   OYD:XAJOINOMVE   08/16/2019 Patellofemoral.  Likely has some mild underlying arthritis pain topical anti-inflammatories given, hydrocodone, work with Event organiser, Tru pull lite brace given, patient will try conservative therapy for the next 6 weeks and follow-up at that time.  Worsening pain consider injection and formal physical therapy.  09/26/2019 Summer Anderson is a 38 y.o. female coming in with complaint of right knee pain. States the knee is doing much better. 75% improvement.  Patient states that it is able to run somewhat now.  Has not had any instability, has not noticed any swelling at this time.     Past Medical History:  Diagnosis Date  . Abnormal breast biopsy 06/13/2016   Right, PSEUDOANGIOMATOUS STROMAL HYPERPLASIA (PASH). 6 month f/u   . Acute bronchitis 09/18/2017  . Allergy   . Anxiety 06/13/2016  . Chronic headaches   . Depression    hx of, no medications since 12/15  . Kidney stones 2004  . Migraines    Started in HS Aura, nausea MRI normal Did not tolerate topamax in the past Excedrin migraine as needed No known triggers, can have a few a week or none for a couple of weeks   . Patella-femoral syndrome 06/11/2015  . Rash 01/28/2018  . Right knee pain 07/25/2019  . Sore throat 01/28/2018  . SVT (supraventricular tachycardia) (HCC) 07/25/2019  . Weight gain 09/22/2014   Past Surgical History:  Procedure Laterality Date  . BREAST BIOPSY Right    PSEUDOANGIOMATOUS STROMAL HYPERPLASIA (PASH).  Marland Kitchen BREAST ENHANCEMENT SURGERY  12/2013  . EP study  2006   performed in [place],  no inducible arrhythmias per patient   Social History   Socioeconomic History  . Marital status: Married    Spouse name: Not on file  . Number of children: 2  . Years of education: Not on  file  . Highest education level: Not on file  Occupational History  . Not on file  Social Needs  . Financial resource strain: Not on file  . Food insecurity    Worry: Not on file    Inability: Not on file  . Transportation needs    Medical: Not on file    Non-medical: Not on file  Tobacco Use  . Smoking status: Never Smoker  . Smokeless tobacco: Never Used  Substance and Sexual Activity  . Alcohol use: Yes    Alcohol/week: 0.0 standard drinks    Comment: Occasioanally  . Drug use: No  . Sexual activity: Not on file  Lifestyle  . Physical activity    Days per week: Not on file    Minutes per session: Not on file  . Stress: Not on file  Relationships  . Social Musician on phone: Not on file    Gets together: Not on file    Attends religious service: Not on file    Active member of club or organization: Not on file    Attends meetings of clubs or organizations: Not on file    Relationship status: Not on file  Other Topics Concern  . 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   She lives with husband and two daughters.  Exercise:  running      Allergies  Allergen Reactions  . Oxycodone-Acetaminophen Nausea Only   Family History  Problem Relation Age of Onset  . Acute myelogenous leukemia Father 68  . Healthy Mother   . Parkinson's disease Maternal Grandfather        uncetain dx  . Healthy Brother   . Healthy Sister   . Healthy Daughter     Current Outpatient Medications (Endocrine & Metabolic):  .  levonorgestrel (MIRENA) 20 MCG/24HR IUD, 1 Intra Uterine Device (1 each total) by Intrauterine route once.    Current Outpatient Medications (Analgesics):  .  aspirin-acetaminophen-caffeine (EXCEDRIN MIGRAINE) 250-250-65 MG tablet, Take 1 tablet by mouth every 6 (six) hours as needed for headache. .  rizatriptan (MAXALT-MLT) 10 MG disintegrating tablet, DISSOLVE 1 TABLET (10 MG TOTAL) BY  MOUTH AS NEEDED FOR MIGRAINE. MAY REPEAT IN 2 HOURS IF NEEDED   Current Outpatient Medications (Other):  Marland Kitchen  ALPRAZolam (XANAX) 0.25 MG tablet, Take 1 tablet (0.25 mg total) by mouth 3 (three) times daily as needed. Marland Kitchen  amitriptyline (ELAVIL) 25 MG tablet, Take 1 tablet (25 mg total) by mouth at bedtime. .  Biotin 10000 MCG TABS, Take by mouth daily. .  Cholecalciferol (VITAMIN D-3) 5000 units TABS, Take 5,000 Units by mouth daily. .  Diclofenac Sodium (PENNSAID) 2 % SOLN, Place 2 g onto the skin 2 (two) times daily. Marland Kitchen  PARoxetine (PAXIL) 10 MG tablet, Take 1 tablet (10 mg total) by mouth daily.    Past medical history, social, surgical and family history all reviewed in electronic medical record.  No pertanent information unless stated regarding to the chief complaint.   Review of Systems:  No headache, visual changes, nausea, vomiting, diarrhea, constipation, dizziness, abdominal pain, skin rash, fevers, chills, night sweats, weight loss, swollen lymph nodes, body aches, joint swelling, muscle aches, chest pain, shortness of breath, mood changes.   Objective  Blood pressure 140/70, pulse 83, height 5\' 4"  (1.626 m), weight 140 lb (63.5 kg), SpO2 98 %.    General: No apparent distress alert and oriented x3 mood and affect normal, dressed appropriately.  HEENT: Pupils equal, extraocular movements intact  Respiratory: Patient's speak in full sentences and does not appear short of breath  Cardiovascular: No lower extremity edema, non tender, no erythema  Skin: Warm dry intact with no signs of infection or rash on extremities or on axial skeleton.  Abdomen: Soft nontender  Neuro: Cranial nerves II through XII are intact, neurovascularly intact in all extremities with 2+ DTRs and 2+ pulses.  Lymph: No lymphadenopathy of posterior or anterior cervical chain or axillae bilaterally.  Gait normal with good balance and coordination.  MSK:  Non tender with full range of motion and good stability  and symmetric strength and tone of shoulders, elbows, wrist, hip, and ankles bilaterally.  Knee: Right On inspection still some mild lateral tracking of the patella noted. Palpation normal with no warmth, joint line tenderness, patellar tenderness, or condyle tenderness. ROM full in flexion and extension and lower leg rotation. Ligaments with solid consistent endpoints including ACL, PCL, LCL, MCL. Negative Mcmurray's, Apley's, and Thessalonian tests. painful patellar compression. Patellar glide with moderate crepitus. Patellar and quadriceps tendons unremarkable. Hamstring and quadriceps strength is normal. Contralateral knee unremarkable    Impression and Recommendations:      The above documentation has been reviewed and is accurate and complete Lyndal Pulley, DO       Note: This dictation was prepared with Viviann Spare  dictation along with smaller phrase technology. Any transcriptional errors that result from this process are unintentional.

## 2019-09-26 ENCOUNTER — Other Ambulatory Visit: Payer: Self-pay

## 2019-09-26 ENCOUNTER — Encounter: Payer: Self-pay | Admitting: Family Medicine

## 2019-09-26 ENCOUNTER — Ambulatory Visit (INDEPENDENT_AMBULATORY_CARE_PROVIDER_SITE_OTHER): Payer: BC Managed Care – PPO | Admitting: Family Medicine

## 2019-09-26 DIAGNOSIS — M222X1 Patellofemoral disorders, right knee: Secondary | ICD-10-CM | POA: Diagnosis not present

## 2019-09-26 NOTE — Assessment & Plan Note (Signed)
Patellofemoral arthritis doing relatively well at this time.  No significant changes.  As long as patient does well with conservative therapy I would like patient to follow-up on an as-needed basis.  If worsening pain we will consider formal physical therapy and injection.

## 2019-09-26 NOTE — Patient Instructions (Addendum)
Good to see you Keep doing what you're doing Exercise once or twice a week See me again in 8 weeks

## 2019-10-07 ENCOUNTER — Encounter: Payer: Self-pay | Admitting: Internal Medicine

## 2019-10-07 ENCOUNTER — Telehealth (INDEPENDENT_AMBULATORY_CARE_PROVIDER_SITE_OTHER): Payer: BC Managed Care – PPO | Admitting: Internal Medicine

## 2019-10-07 VITALS — Ht 64.0 in | Wt 137.0 lb

## 2019-10-07 DIAGNOSIS — R002 Palpitations: Secondary | ICD-10-CM

## 2019-10-07 NOTE — Progress Notes (Signed)
Electrophysiology TeleHealth Note   Due to national recommendations of social distancing due to COVID 19, an audio/video telehealth visit is felt to be most appropriate for this patient at this time.  See MyChart message from today for the patient's consent to telehealth for [facility].   Date:  10/07/2019   ID:  Summer Anderson, DOB 02-22-1981, MRN [id number]  Location: patient's home  Provider location:  [facility]  Evaluation Performed: Follow-up visit  PCP:  Pincus Sanes, MD   Electrophysiologist:  Dr Johney Frame  Chief Complaint:  palpitations  History of Present Illness:    Summer Anderson is a 38 y.o. female who presents via telehealth conferencing today.  Since last being seen in our clinic, the patient reports doing very well.  She continues to have occasional palpitations. Recent event monitor reveals that this is sinus rhythm/ sinus tachycardia in origin.  Today, she denies symptoms of chest pain, shortness of breath,  lower extremity edema, dizziness, presyncope, or syncope.  The patient is otherwise without complaint today.  The patient denies symptoms of fevers, chills, cough, or new SOB worrisome for COVID 19.  Past Medical History:  Diagnosis Date  . Abnormal breast biopsy 06/13/2016   Right, PSEUDOANGIOMATOUS STROMAL HYPERPLASIA (PASH). 6 month f/u   . Acute bronchitis 09/18/2017  . Allergy   . Anxiety 06/13/2016  . Chronic headaches   . Depression    hx of, no medications since 12/15  . Kidney stones 2004  . Migraines    Started in HS Aura, nausea MRI normal Did not tolerate topamax in the past Excedrin migraine as needed No known triggers, can have a few a week or none for a couple of weeks   . Patella-femoral syndrome 06/11/2015  . Rash 01/28/2018  . Right knee pain 07/25/2019  . Sore throat 01/28/2018  . Tachycardia 07/25/2019   sinus tachycardia  . Weight gain 09/22/2014    Past Surgical History:  Procedure Laterality Date  . BREAST BIOPSY Right    PSEUDOANGIOMATOUS STROMAL HYPERPLASIA (PASH).  Marland Kitchen BREAST ENHANCEMENT SURGERY  12/2013  . EP study  2006   performed in [place],  no inducible arrhythmias per patient    Current Outpatient Medications  Medication Sig Dispense Refill  . amitriptyline (ELAVIL) 25 MG tablet Take 1 tablet (25 mg total) by mouth at bedtime. 90 tablet 0  . aspirin-acetaminophen-caffeine (EXCEDRIN MIGRAINE) 250-250-65 MG tablet Take 1 tablet by mouth every 6 (six) hours as needed for headache. 30 tablet 0  . Biotin 56314 MCG TABS Take by mouth daily.    . Cholecalciferol (VITAMIN D-3) 5000 units TABS Take 5,000 Units by mouth daily.    . Diclofenac Sodium (PENNSAID) 2 % SOLN Place 2 g onto the skin 2 (two) times daily. 112 g 3  . levonorgestrel (MIRENA) 20 MCG/24HR IUD 1 Intra Uterine Device (1 each total) by Intrauterine route once. 1 each 0  . PARoxetine (PAXIL) 10 MG tablet Take 1 tablet (10 mg total) by mouth daily. 30 tablet 5  . rizatriptan (MAXALT-MLT) 10 MG disintegrating tablet DISSOLVE 1 TABLET (10 MG TOTAL) BY MOUTH AS NEEDED FOR MIGRAINE. MAY REPEAT IN 2 HOURS IF NEEDED 10 tablet 11   No current facility-administered medications for this visit.     Allergies:   Oxycodone-acetaminophen   Social History:  The patient  reports that she has never smoked. She has never used smokeless tobacco. She reports current alcohol use. She reports that she does not use drugs.  Family History:  The patient's family history includes Acute myelogenous leukemia (age of onset: 55) in her father; Healthy in her brother, daughter, mother, and sister; Parkinson's disease in her maternal grandfather.   ROS:  Please see the history of present illness.   All other systems are personally reviewed and negative.    Exam:    Vital Signs:  Ht 5\' 4"  (1.626 m)   Wt 137 lb (62.1 kg)   BMI 23.52 kg/m   Well sounding and appearing, alert and conversant, regular work of breathing,  good skin color Eyes- anicteric, neuro- grossly  intact, skin- no apparent rash or lesions or cyanosis, mouth- oral mucosa is pink  Labs/Other Tests and Data Reviewed:    Recent Labs: 07/22/2019: ALT 10; BUN 15; Creatinine, Ser 0.72; Hemoglobin 12.9; Platelets 208.0; Potassium 4.2; Sodium 139; TSH 1.62   Wt Readings from Last 3 Encounters:  10/07/19 137 lb (62.1 kg)  09/26/19 140 lb (63.5 kg)  08/16/19 137 lb (62.1 kg)    Recent event monitor is reviewed and reveals no arrhythmias,  Symptomatic transmissions were for sinus  ASSESSMENT & PLAN:    1.  palpitations Recent event monitor is reviewed and symptomatic transmissions correspond to sinus rhythm/ sinus tachycardia.  No arrhythmias Prior EP study was normal. No further EP workup is planned  Regular exercise, caffeine avoidance, adequate hydration are encouraged  She takes Excedrin migraine several times per week which could be exacerbating symptoms.  I have advised that she stop using this.  She may benefit from propranolol for migraines as well as palpitations.  She does not wish to make change today but is willing to talk with Dr Quay Burow about this in the future.   Follow-up:  As needed   Patient Risk:  after full review of this patients clinical status, I feel that they are at moderate risk at this time.  Today, I have spent 15 minutes with the patient with telehealth technology discussing arrhythmia management .    Army Fossa, MD  10/07/2019 3:39 PM     [address] [place] [address] [phone] (office) [phone] (fax)

## 2019-10-09 ENCOUNTER — Encounter: Payer: Self-pay | Admitting: Internal Medicine

## 2019-10-09 MED ORDER — PROPRANOLOL HCL ER 60 MG PO CP24: 60.0000 mg | ORAL_CAPSULE | Freq: Every day | ORAL | 5 refills | Status: DC

## 2019-10-10 MED FILL — PROPRANOLOL HCL ER 60 MG CP: 60 | 30 days supply | Qty: 30 | Fill #0

## 2019-10-11 DIAGNOSIS — Z124 Encounter for screening for malignant neoplasm of cervix: Secondary | ICD-10-CM | POA: Diagnosis not present

## 2019-10-11 DIAGNOSIS — Z6823 Body mass index (BMI) 23.0-23.9, adult: Secondary | ICD-10-CM | POA: Diagnosis not present

## 2019-10-11 DIAGNOSIS — Z01419 Encounter for gynecological examination (general) (routine) without abnormal findings: Secondary | ICD-10-CM | POA: Diagnosis not present

## 2019-10-11 MED FILL — PARoxetine HCL 10 MG TABS: 10 | 30 days supply | Qty: 30 | Fill #1

## 2019-10-24 MED FILL — AMITRIPTYLINE HCL 25 MG TAB: 25 | 30 days supply | Qty: 30 | Fill #2

## 2019-11-10 MED FILL — PROPRANOLOL HCL ER 60 MG CP: 60 | 30 days supply | Qty: 30 | Fill #1

## 2019-11-10 MED FILL — PARoxetine HCL 10 MG TABS: 10 | 30 days supply | Qty: 30 | Fill #2

## 2019-11-21 ENCOUNTER — Ambulatory Visit: Payer: BC Managed Care – PPO | Admitting: Family Medicine

## 2019-11-29 MED FILL — AMITRIPTYLINE HCL 25 MG TAB: 25 | 30 days supply | Qty: 30 | Fill #0

## 2019-12-13 ENCOUNTER — Other Ambulatory Visit: Payer: Self-pay

## 2019-12-13 MED ORDER — AMITRIPTYLINE HCL 25 MG PO TABS: 25.0000 mg | ORAL_TABLET | Freq: Every day | ORAL | 1 refills | Status: DC

## 2019-12-13 MED ORDER — PAROXETINE HCL 10 MG PO TABS: 10.0000 mg | ORAL_TABLET | Freq: Every day | ORAL | 1 refills | Status: DC

## 2019-12-15 ENCOUNTER — Encounter: Payer: Self-pay | Admitting: Internal Medicine

## 2019-12-15 DIAGNOSIS — G43809 Other migraine, not intractable, without status migrainosus: Secondary | ICD-10-CM

## 2019-12-18 NOTE — Addendum Note (Signed)
Addended by: Pincus Sanes on: 12/18/2019 10:56 AM   Modules accepted: Orders

## 2019-12-19 MED ORDER — AIMOVIG 70 MG/ML ~~LOC~~ SOAJ: 70.0000 mg | SUBCUTANEOUS | 3 refills | Status: DC

## 2019-12-19 NOTE — Addendum Note (Signed)
Addended by: Pincus Sanes on: 12/19/2019 08:24 AM   Modules accepted: Orders

## 2019-12-21 NOTE — Addendum Note (Signed)
Addended by: Pincus Sanes on: 12/21/2019 01:21 PM   Modules accepted: Orders

## 2019-12-26 ENCOUNTER — Telehealth: Payer: Self-pay

## 2019-12-26 NOTE — Telephone Encounter (Signed)
[facility] called in wanting to clarity of patients medications    Did not tell me medications, and there was a verbal issue also   Please call and advise for more information

## 2019-12-27 NOTE — Telephone Encounter (Signed)
Pharmacy aware

## 2019-12-27 NOTE — Telephone Encounter (Signed)
Ok to fill - she is on a very low dose of both

## 2019-12-27 NOTE — Telephone Encounter (Signed)
Called [facility] spoke w/technician she states there is a drug interaction w/the rx amitriptyline that was sent in with the pt paroxetine. When taking together it increase the risk of serotonins syndrome. Is this still ok to fill.Summer KitchenRaechel Chute

## 2020-01-03 ENCOUNTER — Telehealth: Payer: Self-pay | Admitting: Internal Medicine

## 2020-01-03 NOTE — Telephone Encounter (Signed)
     Pt c/o medication issue:  1. Name of Medication: Erenumab-aooe (AIMOVIG) 70 MG/ML SOAJ  2. How are you currently taking this medication (dosage and times per day)? AS WRITTEN  3. Are you having a reaction (difficulty breathing--STAT)? NO  4. What is your medication issue? [facility] calling to request prior auth, please call [phone]

## 2020-01-04 NOTE — Telephone Encounter (Signed)
PA initiated- [id number]

## 2020-01-10 NOTE — Telephone Encounter (Signed)
PA denied. Waiting on appeal form. My chart message sent letting pt know.

## 2020-01-30 ENCOUNTER — Encounter: Payer: Self-pay | Admitting: Neurology

## 2020-01-30 ENCOUNTER — Other Ambulatory Visit: Payer: Self-pay

## 2020-01-30 ENCOUNTER — Ambulatory Visit (INDEPENDENT_AMBULATORY_CARE_PROVIDER_SITE_OTHER): Payer: 59 | Admitting: Neurology

## 2020-01-30 VITALS — BP 115/65 | HR 92 | Temp 97.6°F | Ht 64.0 in | Wt 140.0 lb

## 2020-01-30 DIAGNOSIS — G43709 Chronic migraine without aura, not intractable, without status migrainosus: Secondary | ICD-10-CM | POA: Diagnosis not present

## 2020-01-30 MED ORDER — AJOVY 225 MG/1.5ML ~~LOC~~ SOAJ: 225.0000 mg | SUBCUTANEOUS | 11 refills | Status: DC

## 2020-01-30 MED ORDER — NURTEC 75 MG PO TBDP: 75.0000 mg | ORAL_TABLET | Freq: Every day | ORAL | 6 refills | Status: DC | PRN

## 2020-01-30 MED ORDER — ONDANSETRON 4 MG PO TBDP: 4.0000 mg | ORAL_TABLET | Freq: Three times a day (TID) | ORAL | 3 refills | Status: AC | PRN

## 2020-01-30 NOTE — Patient Instructions (Signed)
Stop Aimovig, start Ajovy Acute management: Nurtec Nausea: zofran Continue amitriptyline  Ondansetron oral dissolving tablet What is this medicine? ONDANSETRON (on DAN se tron) is used to treat nausea and vomiting caused by chemotherapy. It is also used to prevent or treat nausea and vomiting after surgery. This medicine may be used for other purposes; ask your health care provider or pharmacist if you have questions. COMMON BRAND NAME(S): Zofran ODT What should I tell my health care provider before I take this medicine? They need to know if you have any of these conditions:  heart disease  history of irregular heartbeat  liver disease  low levels of magnesium or potassium in the blood  an unusual or allergic reaction to ondansetron, granisetron, other medicines, foods, dyes, or preservatives  pregnant or trying to get pregnant  breast-feeding How should I use this medicine? These tablets are made to dissolve in the mouth. Do not try to push the tablet through the foil backing. With dry hands, peel away the foil backing and gently remove the tablet. Place the tablet in the mouth and allow it to dissolve, then swallow. While you may take these tablets with water, it is not necessary to do so. Talk to your pediatrician regarding the use of this medicine in children. Special care may be needed. Overdosage: If you think you have taken too much of this medicine contact a poison control center or emergency room at once. NOTE: This medicine is only for you. Do not share this medicine with others. What if I miss a dose? If you miss a dose, take it as soon as you can. If it is almost time for your next dose, take only that dose. Do not take double or extra doses. What may interact with this medicine? Do not take this medicine with any of the following medications:  apomorphine  certain medicines for fungal infections like fluconazole, itraconazole, ketoconazole, posaconazole,  voriconazole  cisapride  dronedarone  pimozide  thioridazine This medicine may also interact with the following medications:  carbamazepine  certain medicines for depression, anxiety, or psychotic disturbances  fentanyl  linezolid  MAOIs like Carbex, Eldepryl, Marplan, Nardil, and Parnate  methylene blue (injected into a vein)  other medicines that prolong the QT interval (cause an abnormal heart rhythm) like dofetilide, ziprasidone  phenytoin  rifampicin  tramadol This list may not describe all possible interactions. Give your health care provider a list of all the medicines, herbs, non-prescription drugs, or dietary supplements you use. Also tell them if you smoke, drink alcohol, or use illegal drugs. Some items may interact with your medicine. What should I watch for while using this medicine? Check with your doctor or health care professional as soon as you can if you have any sign of an allergic reaction. What side effects may I notice from receiving this medicine? Side effects that you should report to your doctor or health care professional as soon as possible:  allergic reactions like skin rash, itching or hives, swelling of the face, lips, or tongue  breathing problems  confusion  dizziness  fast or irregular heartbeat  feeling faint or lightheaded, falls  fever and chills  loss of balance or coordination  seizures  sweating  swelling of the hands and feet  tightness in the chest  tremors  unusually weak or tired Side effects that usually do not require medical attention (report to your doctor or health care professional if they continue or are bothersome):  constipation or diarrhea  headache This list may not describe all possible side effects. Call your doctor for medical advice about side effects. You may report side effects to FDA at 1-800-FDA-1088. Where should I keep my medicine? Keep out of the reach of children. Store between 2  and 30 degrees C (36 and 86 degrees F). Throw away any unused medicine after the expiration date. NOTE: This sheet is a summary. It may not cover all possible information. If you have questions about this medicine, talk to your doctor, pharmacist, or health care provider.  2020 Elsevier/Gold Standard (2018-11-02 07:14:10) Rimegepant oral dissolving tablet What is this medicine? RIMEGEPANT (ri ME je pant) is used to treat migraine headaches with or without aura. An aura is a strange feeling or visual disturbance that warns you of an attack. It is not used to prevent migraines. This medicine may be used for other purposes; ask your health care provider or pharmacist if you have questions. COMMON BRAND NAME(S): NURTEC ODT What should I tell my health care provider before I take this medicine? They need to know if you have any of these conditions:  kidney disease  liver disease  an unusual or allergic reaction to rimegepant, other medicines, foods, dyes, or preservatives  pregnant or trying to get pregnant  breast-feeding How should I use this medicine? Take the medicine by mouth. Follow the directions on the prescription label. Leave the tablet in the sealed blister pack until you are ready to take it. With dry hands, open the blister and gently remove the tablet. If the tablet breaks or crumbles, throw it away and take a new tablet out of the blister pack. Place the tablet in the mouth and allow it to dissolve, and then swallow. Do not cut, crush, or chew this medicine. You do not need water to take this medicine. Talk to your pediatrician about the use of this medicine in children. Special care may be needed. Overdosage: If you think you have taken too much of this medicine contact a poison control center or emergency room at once. NOTE: This medicine is only for you. Do not share this medicine with others. What if I miss a dose? This does not apply. This medicine is not for regular  use. What may interact with this medicine? This medicine may interact with the following medications:  certain medicines for fungal infections like fluconazole, itraconazole  rifampin This list may not describe all possible interactions. Give your health care provider a list of all the medicines, herbs, non-prescription drugs, or dietary supplements you use. Also tell them if you smoke, drink alcohol, or use illegal drugs. Some items may interact with your medicine. What should I watch for while using this medicine? Visit your health care professional for regular checks on your progress. Tell your health care professional if your symptoms do not start to get better or if they get worse. What side effects may I notice from receiving this medicine? Side effects that you should report to your doctor or health care professional as soon as possible:  allergic reactions like skin rash, itching or hives; swelling of the face, lips, or tongue Side effects that usually do not require medical attention (report these to your doctor or health care professional if they continue or are bothersome):  nausea This list may not describe all possible side effects. Call your doctor for medical advice about side effects. You may report side effects to FDA at 1-800-FDA-1088. Where should I keep my medicine? Keep out of  the reach of children. Store at room temperature between 15 and 30 degrees C (59 and 86 degrees F). Throw away any unused medicine after the expiration date. NOTE: This sheet is a summary. It may not cover all possible information. If you have questions about this medicine, talk to your doctor, pharmacist, or health care provider.  2020 Elsevier/Gold Standard (2019-01-24 00:21:31) Vernell Barrier injection What is this medicine? FREMANEZUMAB (fre ma NEZ ue mab) is used to prevent migraine headaches. This medicine may be used for other purposes; ask your health care provider or pharmacist if you have  questions. COMMON BRAND NAME(S): AJOVY What should I tell my health care provider before I take this medicine? They need to know if you have any of these conditions:  an unusual or allergic reaction to fremanezumab, other medicines, foods, dyes, or preservatives  pregnant or trying to get pregnant  breast-feeding How should I use this medicine? This medicine is for injection under the skin. You will be taught how to prepare and give this medicine. Use exactly as directed. Take your medicine at regular intervals. Do not take your medicine more often than directed. It is important that you put your used needles and syringes in a special sharps container. Do not put them in a trash can. If you do not have a sharps container, call your pharmacist or healthcare provider to get one. Talk to your pediatrician regarding the use of this medicine in children. Special care may be needed. Overdosage: If you think you have taken too much of this medicine contact a poison control center or emergency room at once. NOTE: This medicine is only for you. Do not share this medicine with others. What if I miss a dose? If you miss a dose, take it as soon as you can. If it is almost time for your next dose, take only that dose. Do not take double or extra doses. What may interact with this medicine? Interactions are not expected. This list may not describe all possible interactions. Give your health care provider a list of all the medicines, herbs, non-prescription drugs, or dietary supplements you use. Also tell them if you smoke, drink alcohol, or use illegal drugs. Some items may interact with your medicine. What should I watch for while using this medicine? Tell your doctor or healthcare professional if your symptoms do not start to get better or if they get worse. What side effects may I notice from receiving this medicine? Side effects that you should report to your doctor or health care professional as soon  as possible:  allergic reactions like skin rash, itching or hives, swelling of the face, lips, or tongue Side effects that usually do not require medical attention (report these to your doctor or health care professional if they continue or are bothersome):  pain, redness, or irritation at site where injected This list may not describe all possible side effects. Call your doctor for medical advice about side effects. You may report side effects to FDA at 1-800-FDA-1088. Where should I keep my medicine? Keep out of the reach of children. You will be instructed on how to store this medicine. Throw away any unused medicine after the expiration date on the label. NOTE: This sheet is a summary. It may not cover all possible information. If you have questions about this medicine, talk to your doctor, pharmacist, or health care provider.  2020 Elsevier/Gold Standard (2017-08-10 17:22:56)

## 2020-01-30 NOTE — Progress Notes (Signed)
[facility]    Provider:  Dr Lucia Gaskins Requesting Provider: Pincus Sanes, MD Primary Care Provider:  Pincus Sanes, MD  CC:  migraines  HPI:  Summer Anderson is a 39 y.o. female here as requested by Pincus Sanes, MD for migraines. PMHx anxiety, chronic headaches, depression, kidney stones, sinus tachycardia. Migraines started in HS. I reviewed records and meds, appears she was started on Aimovig recently and also tried several other medications in the past. She has had migraines since HS. They are usually on the left but sometimes on the right periorbital ut more upper forehead, she vomits, she has nausea, she cannot figure out triggers, she has mirena, she did a food and sleep diary and never found triggers, sister has migraines too. Also her 66 year old has them now. They are pounding, light and sound sensitivity, she has tried multiple triptans and maxalt works but she feels like she is in a fog. Side effects to the medication. Excedrin migraine works. 23 headache days a month, she is not going to have children and has mirena. No aura. No medication overuse. She has 10-12 moderate to severe migraine days a month that can last 24-72 hours. She has new symptoms, the migraines are waking her up and worse within the last year. The severity and frequency are stable but the duration is worsening and longer. She feels the aimovig is wearing. No other focal neurologic deficits, associated symptoms, inciting events or modifiable factors.  Reviewed notes, labs and imaging from outside physicians, which showed:  Meds tried: topamax, elavil, excedrin prn, diclofenac,paxil, maxalt, aimovig, relpax, lexapro, magnesium, propranolol, zomig, zonegran  06/2019: TSH 1.62 normal. Cbc unremarkable, cmp normal  MRI brain 2014: showed No acute intracranial abnormalities including mass lesion or mass effect, hydrocephalus, extra-axial fluid collection, midline shift, hemorrhage, or acute infarction,  large ischemic events (personally reviewed images)   Review of Systems: Patient complains of symptoms per HPI as well as the following symptoms: headache, nausea. Pertinent negatives and positives per HPI. All others negative.   Social History   Socioeconomic History  . Marital status: Married    Spouse name: Not on file  . Number of children: 2  . Years of education: Not on file  . Highest education level: Bachelor's degree (e.g., BA, AB, BS)  Occupational History  . Not on file  Tobacco Use  . Smoking status: Never Smoker  . Smokeless tobacco: Never Used  Substance and Sexual Activity  . Alcohol use: Yes    Alcohol/week: 0.0 standard drinks    Comment: Occasioanally  . Drug use: No  . Sexual activity: Not on file  Other Topics Concern  . 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   She lives with husband and two daughters.    Exercise:  running   Right handed   Caffeine: 1 cup coffee/day   Social Determinants of Health   Financial Resource Strain:   . Difficulty of Paying Living Expenses: Not on file  Food Insecurity:   . Worried About Programme researcher, broadcasting/film/video in the Last Year: Not on file  . Ran Out of Food in the Last Year: Not on file  Transportation Needs:   . Lack of Transportation (Medical): Not on file  . Lack of Transportation (Non-Medical): Not on file  Physical Activity:   . Days of Exercise per Week: Not on file  . Minutes of Exercise  per Session: Not on file  Stress:   . Feeling of Stress : Not on file  Social Connections:   . Frequency of Communication with Friends and Family: Not on file  . Frequency of Social Gatherings with Friends and Family: Not on file  . Attends Religious Services: Not on file  . Active Member of Clubs or Organizations: Not on file  . Attends Banker Meetings: Not on file  . Marital Status: Not on file  Intimate Partner Violence:   . Fear of Current  or Ex-Partner: Not on file  . Emotionally Abused: Not on file  . Physically Abused: Not on file  . Sexually Abused: Not on file    Family History  Problem Relation Age of Onset  . Acute myelogenous leukemia Father 35  . Healthy Mother   . Migraines Sister   . Parkinson's disease Maternal Grandfather        uncetain dx  . Healthy Brother   . Healthy Sister   . Healthy Daughter   . Migraines Daughter     Past Medical History:  Diagnosis Date  . Abnormal breast biopsy 06/13/2016   Right, PSEUDOANGIOMATOUS STROMAL HYPERPLASIA (PASH). 6 month f/u   . Acute bronchitis 09/18/2017  . Allergy   . Anxiety 06/13/2016  . Chronic headaches   . Depression    hx of, no medications since 12/15  . Kidney stones 2004  . Migraines    Started in HS Aura, nausea MRI normal Did not tolerate topamax in the past Excedrin migraine as needed No known triggers, can have a few a week or none for a couple of weeks   . Patella-femoral syndrome 06/11/2015  . Rash 01/28/2018  . Right knee pain 07/25/2019  . Sore throat 01/28/2018  . Tachycardia 07/25/2019   sinus tachycardia  . Weight gain 09/22/2014    Patient Active Problem List   Diagnosis Date Noted  . SVT (supraventricular tachycardia) (HCC) 07/25/2019  . Right knee pain 07/25/2019  . Abnormal breast biopsy 06/13/2016  . Anxiety 06/13/2016  . Patella-femoral syndrome 06/11/2015  . Migraines     Past Surgical History:  Procedure Laterality Date  . BREAST BIOPSY Right    PSEUDOANGIOMATOUS STROMAL HYPERPLASIA (PASH).  Marland Kitchen BREAST ENHANCEMENT SURGERY  12/2013  . EP study  2006   performed in [place],  no inducible arrhythmias per patient    Current Outpatient Medications  Medication Sig Dispense Refill  . amitriptyline (ELAVIL) 25 MG tablet Take 1 tablet (25 mg total) by mouth at bedtime. 90 tablet 1  . aspirin-acetaminophen-caffeine (EXCEDRIN MIGRAINE) 250-250-65 MG tablet Take 1 tablet by mouth every 6 (six) hours as needed for headache. 30  tablet 0  . Biotin 39030 MCG TABS Take by mouth daily.    . Cholecalciferol (VITAMIN D-3) 5000 units TABS Take 5,000 Units by mouth daily.    . Diclofenac Sodium (PENNSAID) 2 % SOLN Place 2 g onto the skin 2 (two) times daily. 112 g 3  . levonorgestrel (MIRENA) 20 MCG/24HR IUD 1 Intra Uterine Device (1 each total) by Intrauterine route once. 1 each 0  . PARoxetine (PAXIL) 10 MG tablet Take 1 tablet (10 mg total) by mouth daily. 90 tablet 1  . rizatriptan (MAXALT-MLT) 10 MG disintegrating tablet DISSOLVE 1 TABLET (10 MG TOTAL) BY MOUTH AS NEEDED FOR MIGRAINE. MAY REPEAT IN 2 HOURS IF NEEDED 10 tablet 11  . Fremanezumab-vfrm (AJOVY) 225 MG/1.5ML SOAJ Inject 225 mg into the skin every 30 (thirty) days. 1  pen 11  . ondansetron (ZOFRAN-ODT) 4 MG disintegrating tablet Take 1-2 tablets (4-8 mg total) by mouth every 8 (eight) hours as needed for nausea. 30 tablet 3  . Rimegepant Sulfate (NURTEC) 75 MG TBDP Take 75 mg by mouth daily as needed. For migraines. Take as close to onset of migraine as possible. One daily maximum. 10 tablet 6   No current facility-administered medications for this visit.    Allergies as of 01/30/2020 - Review Complete 01/30/2020  Allergen Reaction Noted  . Oxycodone-acetaminophen Nausea Only     Vitals: BP 115/65 (BP Location: Right Arm, Patient Position: Sitting)   Pulse 92   Temp 97.6 F (36.4 C) Comment: taken at front  Ht 5\' 4"  (1.626 m)   Wt 140 lb (63.5 kg)   LMP  (Exact Date)   BMI 24.03 kg/m  Last Weight:  Wt Readings from Last 1 Encounters:  01/30/20 140 lb (63.5 kg)   Last Height:   Ht Readings from Last 1 Encounters:  01/30/20 5\' 4"  (1.626 m)     Physical exam: Exam: Gen: NAD, conversant, well nourised,  well groomed                     CV: RRR, no MRG. No Carotid Bruits. No peripheral edema, warm, nontender Eyes: Conjunctivae clear without exudates or hemorrhage  Neuro: Detailed Neurologic Exam  Speech:    Speech is normal; fluent and  spontaneous with normal comprehension.  Cognition:    The patient is oriented to person, place, and time;     recent and remote memory intact;     language fluent;     normal attention, concentration,     fund of knowledge Cranial Nerves:    The pupils are equal, round, and reactive to light. The fundi are normal and spontaneous venous pulsations are present. Visual fields are full to finger confrontation. Extraocular movements are intact. Trigeminal sensation is intact and the muscles of mastication are normal. The face is symmetric. The palate elevates in the midline. Hearing intact. Voice is normal. Shoulder shrug is normal. The tongue has normal motion without fasciculations.   Coordination:    Normal finger to nose and heel to shin. Normal rapid alternating movements.   Gait:    Heel-toe and tandem gait are normal.   Motor Observation:    No asymmetry, no atrophy, and no involuntary movements noted. Tone:    Normal muscle tone.    Posture:    Posture is normal. normal erect    Strength:    Strength is V/V in the upper and lower limbs.      Sensation: intact to LT     Reflex Exam:  DTR's:    Deep tendon reflexes in the upper and lower extremities are normal bilaterally.   Toes:    The toes are downgoing bilaterally.   Clonus:    Clonus is absent.    Assessment/Plan:  39 year old patient with chronic migraines.   Stop Aimovig, start Ajovy Acute management: Nurtec Nausea: zofran Continue amitriptyline We discussed MRI of the brain given new symptom of morning headache, will see how the Ajovy goes and consider if no significant improvement   Stop Aimovig, start Ajovy Acute management: Nurtec Nausea: zofran Continue amitriptyline  Meds ordered this encounter  Medications  . Fremanezumab-vfrm (AJOVY) 225 MG/1.5ML SOAJ    Sig: Inject 225 mg into the skin every 30 (thirty) days.    Dispense:  1 pen  Refill:  11    Patient has copay card; she can have  medication for $5 regardless of insurance approval or copay amount.  . Rimegepant Sulfate (NURTEC) 75 MG TBDP    Sig: Take 75 mg by mouth daily as needed. For migraines. Take as close to onset of migraine as possible. One daily maximum.    Dispense:  10 tablet    Refill:  6    Patient has copay card; she can have medication for $5 regardless of insurance approval or copay amount.  . ondansetron (ZOFRAN-ODT) 4 MG disintegrating tablet    Sig: Take 1-2 tablets (4-8 mg total) by mouth every 8 (eight) hours as needed for nausea.    Dispense:  30 tablet    Refill:  3    Cc: Burns, Bobette Mo, MD,    Naomie Dean, MD  [facility] [address] [place], [place] [address]  Phone [phone] Fax [phone]

## 2020-04-07 ENCOUNTER — Ambulatory Visit: Payer: 59

## 2020-05-29 ENCOUNTER — Other Ambulatory Visit: Payer: Self-pay | Admitting: Internal Medicine

## 2020-06-04 ENCOUNTER — Other Ambulatory Visit: Payer: Self-pay

## 2020-06-04 ENCOUNTER — Encounter: Payer: Self-pay | Admitting: Neurology

## 2020-06-04 ENCOUNTER — Ambulatory Visit (INDEPENDENT_AMBULATORY_CARE_PROVIDER_SITE_OTHER): Payer: 59 | Admitting: Neurology

## 2020-06-04 DIAGNOSIS — G43709 Chronic migraine without aura, not intractable, without status migrainosus: Secondary | ICD-10-CM

## 2020-06-04 MED ORDER — NURTEC 75 MG PO TBDP: 75.0000 mg | ORAL_TABLET | ORAL | 11 refills | Status: DC

## 2020-06-04 NOTE — Patient Instructions (Signed)
Ajovy every 3 weeks Nurtec as needed Do not get pregnant on these medications, must be off of them for -5 months before trying to conceive

## 2020-06-04 NOTE — Progress Notes (Signed)
[facility]    Provider:  Dr Lucia GaskinsAhern Requesting Provider: Pincus SanesBurns, Stacy J, MD Primary Care Provider:  Pincus SanesBurns, Stacy J, MD  CC:  Migraines  June 04, 2020: Patient reports she is doing much better, she is on Ajovy and is helping reduce the frequency, weeks 1-3 the past then she can feel its time for the shot the last week and she will get 2-3 migraines, some do not last long, others may last a day, Nurtec is the best as needed medicine she has used for migraines.We will give her some samples so she can take them every 3 weeks. We discussed Nurtec as a preventative. We will try and get this for her as 16 a month. We also discussed she can pre-treat with nurtec such as prior to her period, or if she thinks she may have a migraine or is having a migraine prodrome.  HPI:  Summer Anderson is a 39 y.o. female here as requested by Pincus SanesBurns, Stacy J, MD for migraines. PMHx anxiety, chronic headaches, depression, kidney stones, sinus tachycardia. Migraines started in HS. I reviewed records and meds, appears she was started on Aimovig recently and also tried several other medications in the past. She has had migraines since HS. They are usually on the left but sometimes on the right periorbital ut more upper forehead, she vomits, she has nausea, she cannot figure out triggers, she has mirena, she did a food and sleep diary and never found triggers, sister has migraines too. Also her 40 year old has them now. They are pounding, light and sound sensitivity, she has tried multiple triptans and maxalt works but she feels like she is in a fog. Side effects to the medication. Excedrin migraine works. 23 headache days a month, she is not going to have children and has mirena. No aura. No medication overuse. She has 10-12 moderate to severe migraine days a month that can last 24-72 hours. She has new symptoms, the migraines are waking her up and worse within the last year. The severity and frequency are stable but  the duration is worsening and longer. She feels the aimovig is wearing. No other focal neurologic deficits, associated symptoms, inciting events or modifiable factors.  Reviewed notes, labs and imaging from outside physicians, which showed:  Meds tried: topamax, elavil, excedrin prn, diclofenac,paxil, maxalt, aimovig, relpax, lexapro, magnesium, propranolol, zomig, zonegran  06/2019: TSH 1.62 normal. Cbc unremarkable, cmp normal  MRI brain 2014: showed No acute intracranial abnormalities including mass lesion or mass effect, hydrocephalus, extra-axial fluid collection, midline shift, hemorrhage, or acute infarction, large ischemic events (personally reviewed images)   Review of Systems: Patient complains of symptoms per HPI as well as the following symptoms: headache, nausea, improved. Pertinent negatives and positives per HPI. All others negative.   Social History   Socioeconomic History  . Marital status: Married    Spouse name: Not on file  . Number of children: 2  . Years of education: Not on file  . Highest education level: Bachelor's degree (e.g., BA, AB, BS)  Occupational History  . Not on file  Tobacco Use  . Smoking status: Never Smoker  . Smokeless tobacco: Never Used  Vaping Use  . Vaping Use: Never used  Substance and Sexual Activity  . Alcohol use: Yes    Alcohol/week: 0.0 standard drinks    Comment: Occasionally   . Drug use: No  . Sexual activity: Not on file  Other Topics Concern  . 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   She lives with husband and two daughters.    Exercise:  running   Right handed   Caffeine: 1 cup coffee/day   Social Determinants of Health   Financial Resource Strain:   . Difficulty of Paying Living Expenses:   Food Insecurity:   . Worried About Programme researcher, broadcasting/film/video in the Last Year:   . Barista in the Last Year:   Transportation Needs:   . Automotive engineer (Medical):   Marland Kitchen Lack of Transportation (Non-Medical):   Physical Activity:   . Days of Exercise per Week:   . Minutes of Exercise per Session:   Stress:   . Feeling of Stress :   Social Connections:   . Frequency of Communication with Friends and Family:   . Frequency of Social Gatherings with Friends and Family:   . Attends Religious Services:   . Active Member of Clubs or Organizations:   . Attends Banker Meetings:   Marland Kitchen Marital Status:   Intimate Partner Violence:   . Fear of Current or Ex-Partner:   . Emotionally Abused:   Marland Kitchen Physically Abused:   . Sexually Abused:     Family History  Problem Relation Age of Onset  . Acute myelogenous leukemia Father 73  . Healthy Mother   . Migraines Sister   . Parkinson's disease Maternal Grandfather        uncetain dx  . Healthy Brother   . Healthy Sister   . Healthy Daughter   . Migraines Daughter     Past Medical History:  Diagnosis Date  . Abnormal breast biopsy 06/13/2016   Right, PSEUDOANGIOMATOUS STROMAL HYPERPLASIA (PASH). 6 month f/u   . Acute bronchitis 09/18/2017  . Allergy   . Anxiety 06/13/2016  . Chronic headaches   . Depression    hx of, no medications since 12/15  . Kidney stones 2004  . Migraines    Started in HS Aura, nausea MRI normal Did not tolerate topamax in the past Excedrin migraine as needed No known triggers, can have a few a week or none for a couple of weeks   . Patella-femoral syndrome 06/11/2015  . Rash 01/28/2018  . Right knee pain 07/25/2019  . Sore throat 01/28/2018  . Tachycardia 07/25/2019   sinus tachycardia  . Weight gain 09/22/2014    Patient Active Problem List   Diagnosis Date Noted  . SVT (supraventricular tachycardia) (HCC) 07/25/2019  . Right knee pain 07/25/2019  . Abnormal breast biopsy 06/13/2016  . Anxiety 06/13/2016  . Patella-femoral syndrome 06/11/2015  . Migraines     Past Surgical History:  Procedure Laterality Date  . BREAST BIOPSY Right      PSEUDOANGIOMATOUS STROMAL HYPERPLASIA (PASH).  Marland Kitchen BREAST ENHANCEMENT SURGERY  12/2013  . EP study  2006   performed in [place],  no inducible arrhythmias per patient    Current Outpatient Medications  Medication Sig Dispense Refill  . amitriptyline (ELAVIL) 25 MG tablet Take 1 tablet (25 mg total) by mouth at bedtime. 90 tablet 1  . aspirin-acetaminophen-caffeine (EXCEDRIN MIGRAINE) 250-250-65 MG tablet Take 1 tablet by mouth every 6 (six) hours as needed for headache. 30 tablet 0  . Biotin 19166 MCG TABS Take by mouth daily.    . Cholecalciferol (VITAMIN D-3) 5000 units TABS Take 5,000 Units by mouth daily.    . Diclofenac Sodium (PENNSAID) 2 % SOLN  Place 2 g onto the skin 2 (two) times daily. 112 g 3  . Fremanezumab-vfrm (AJOVY) 225 MG/1.5ML SOAJ Inject 225 mg into the skin every 30 (thirty) days. 1 pen 11  . levonorgestrel (MIRENA) 20 MCG/24HR IUD 1 Intra Uterine Device (1 each total) by Intrauterine route once. 1 each 0  . ondansetron (ZOFRAN-ODT) 4 MG disintegrating tablet Take 1-2 tablets (4-8 mg total) by mouth every 8 (eight) hours as needed for nausea. 30 tablet 3  . PARoxetine (PAXIL) 10 MG tablet TAKE 1 TABLET DAILY 90 tablet 1  . Rimegepant Sulfate (NURTEC) 75 MG TBDP Take 75 mg by mouth every other day. For migraines. Take as close to onset of migraine as possible. One daily maximum. 16 tablet 11   No current facility-administered medications for this visit.    Allergies as of 06/04/2020 - Review Complete 06/04/2020  Allergen Reaction Noted  . Oxycodone-acetaminophen Nausea Only     Vitals: BP 112/80 (BP Location: Right Arm, Patient Position: Sitting)   Pulse 83   Ht 5\' 4"  (1.626 m)   Wt 142 lb (64.4 kg)   BMI 24.37 kg/m  Last Weight:  Wt Readings from Last 1 Encounters:  06/04/20 142 lb (64.4 kg)   Last Height:   Ht Readings from Last 1 Encounters:  06/04/20 5\' 4"  (1.626 m)   Physical exam: Exam: Gen: NAD, conversant, well nourised, well groomed                      CV: RRR, no MRG. No Carotid Bruits. No peripheral edema, warm, nontender Eyes: Conjunctivae clear without exudates or hemorrhage  Neuro: Detailed Neurologic Exam  Speech:    Speech is normal; fluent and spontaneous with normal comprehension.  Cognition:    The patient is oriented to person, place, and time;     recent and remote memory intact;     language fluent;     normal attention, concentration,     fund of knowledge Cranial Nerves:    The pupils are equal, round, and reactive to light. The fundi are normal and spontaneous venous pulsations are present. Visual fields are full to finger confrontation. Extraocular movements are intact. Trigeminal sensation is intact and the muscles of mastication are normal. The face is symmetric. The palate elevates in the midline. Hearing intact. Voice is normal. Shoulder shrug is normal. The tongue has normal motion without fasciculations.   Gait:  normal   Motor Observation:    No asymmetry, no atrophy, and no involuntary movements noted. Tone:    Normal muscle tone.    Posture:    Posture is normal. normal erect    Strength:    Strength is V/V in the upper and lower limbs.      Sensation: intact to LT         Assessment/Plan:  39 year old patient with chronic migraines.   Stop Aimovig, start Ajovy: doing great Acute management: Nurtec: up to 16 times a month Nausea: zofran continue Continue amitriptyline - may decrease if doing well We discussed MRI of the brain given new symptom of morning headache, will see how the Ajovy goes and consider if no significant improvement - she is doing great, will hold off on imaging   Cc: Burns, , MD,    24, MD  [facility] [address] [place], [address] [place]  Phone [phone] Fax [phone]  I spent 25 minutes of face-to-face and non-face-to-face time with patient  on the  1. Chronic migraine without aura without  status migrainosus, not intractable    diagnosis.  This included previsit chart review, lab review, study review, order entry, electronic health record documentation, patient education on the different diagnostic and therapeutic options, counseling and coordination of care, risks and benefits of management, compliance, or risk factor reduction

## 2020-06-05 ENCOUNTER — Telehealth: Payer: Self-pay | Admitting: Neurology

## 2020-06-05 DIAGNOSIS — G43709 Chronic migraine without aura, not intractable, without status migrainosus: Secondary | ICD-10-CM

## 2020-06-05 MED ORDER — NURTEC 75 MG PO TBDP: 75.0000 mg | ORAL_TABLET | ORAL | 11 refills | Status: DC

## 2020-06-05 NOTE — Addendum Note (Signed)
Addended by: Bertram Savin on: 06/05/2020 09:43 AM   Modules accepted: Orders

## 2020-06-05 NOTE — Telephone Encounter (Signed)
I resent the Rx electronically for Nurtec to [facility] pharmacies. Clarified prescription is 75 mg (1 tablet) by mouth every other day #16 refills 11 per v.o. Dr. Lucia Gaskins.

## 2020-06-05 NOTE — Telephone Encounter (Signed)
[facility] has called re pt's Rimegepant Sulfate (NURTEC) 75 MG TBDP.  She is asking for clarity on the dose.  Please call the [phone] and be connected to a pharmacist.

## 2020-06-11 ENCOUNTER — Other Ambulatory Visit: Payer: Self-pay | Admitting: Internal Medicine

## 2020-07-10 ENCOUNTER — Other Ambulatory Visit: Payer: Self-pay | Admitting: Obstetrics and Gynecology

## 2020-07-10 DIAGNOSIS — Z1231 Encounter for screening mammogram for malignant neoplasm of breast: Secondary | ICD-10-CM

## 2020-07-26 ENCOUNTER — Encounter: Payer: BC Managed Care – PPO | Admitting: Internal Medicine

## 2020-07-29 NOTE — Patient Instructions (Addendum)
°Blood work was ordered.   ° °All other Health Maintenance issues reviewed.   All recommended immunizations and age-appropriate screenings are up-to-date or discussed. ° °No immunization administered today.  ° °Medications reviewed and updated.  Changes include :    ° °Your prescription(s) have been submitted to your pharmacy. Please take as directed and contact our office if you believe you are having problem(s) with the medication(s). ° °A referral was ordered for      Someone will call you to schedule an appointment.  ° ° °Please followup in 1 year ° ° ° °Health Maintenance, Female °Adopting a healthy lifestyle and getting preventive care are important in promoting health and wellness. Ask your health care provider about: °· The right schedule for you to have regular tests and exams. °· Things you can do on your own to prevent diseases and keep yourself healthy. °What should I know about diet, weight, and exercise? °Eat a healthy diet ° °· Eat a diet that includes plenty of vegetables, fruits, low-fat dairy products, and lean protein. °· Do not eat a lot of foods that are high in solid fats, added sugars, or sodium. °Maintain a healthy weight °Body mass index (BMI) is used to identify weight problems. It estimates body fat based on height and weight. Your health care provider can help determine your BMI and help you achieve or maintain a healthy weight. °Get regular exercise °Get regular exercise. This is one of the most important things you can do for your health. Most adults should: °· Exercise for at least 150 minutes each week. The exercise should increase your heart rate and make you sweat (moderate-intensity exercise). °· Do strengthening exercises at least twice a week. This is in addition to the moderate-intensity exercise. °· Spend less time sitting. Even light physical activity can be beneficial. °Watch cholesterol and blood lipids °Have your blood tested for lipids and cholesterol at 40 years of age,  then have this test every 5 years. °Have your cholesterol levels checked more often if: °· Your lipid or cholesterol levels are high. °· You are older than 40 years of age. °· You are at high risk for heart disease. °What should I know about cancer screening? °Depending on your health history and family history, you may need to have cancer screening at various ages. This may include screening for: °· Breast cancer. °· Cervical cancer. °· Colorectal cancer. °· Skin cancer. °· Lung cancer. °What should I know about heart disease, diabetes, and high blood pressure? °Blood pressure and heart disease °· High blood pressure causes heart disease and increases the risk of stroke. This is more likely to develop in people who have high blood pressure readings, are of African descent, or are overweight. °· Have your blood pressure checked: °? Every 3-5 years if you are 18-39 years of age. °? Every year if you are 40 years old or older. °Diabetes °Have regular diabetes screenings. This checks your fasting blood sugar level. Have the screening done: °· Once every three years after age 40 if you are at a normal weight and have a low risk for diabetes. °· More often and at a younger age if you are overweight or have a high risk for diabetes. °What should I know about preventing infection? °Hepatitis B °If you have a higher risk for hepatitis B, you should be screened for this virus. Talk with your health care provider to find out if you are at risk for hepatitis B infection. °Hepatitis C °  Testing is recommended for: °· Everyone born from 1945 through 1965. °· Anyone with known risk factors for hepatitis C. °Sexually transmitted infections (STIs) °· Get screened for STIs, including gonorrhea and chlamydia, if: °? You are sexually active and are younger than 40 years of age. °? You are older than 40 years of age and your health care provider tells you that you are at risk for this type of infection. °? Your sexual activity has  changed since you were last screened, and you are at increased risk for chlamydia or gonorrhea. Ask your health care provider if you are at risk. °· Ask your health care provider about whether you are at high risk for HIV. Your health care provider may recommend a prescription medicine to help prevent HIV infection. If you choose to take medicine to prevent HIV, you should first get tested for HIV. You should then be tested every 3 months for as long as you are taking the medicine. °Pregnancy °· If you are about to stop having your period (premenopausal) and you may become pregnant, seek counseling before you get pregnant. °· Take 400 to 800 micrograms (mcg) of folic acid every day if you become pregnant. °· Ask for birth control (contraception) if you want to prevent pregnancy. °Osteoporosis and menopause °Osteoporosis is a disease in which the bones lose minerals and strength with aging. This can result in bone fractures. If you are 65 years old or older, or if you are at risk for osteoporosis and fractures, ask your health care provider if you should: °· Be screened for bone loss. °· Take a calcium or vitamin D supplement to lower your risk of fractures. °· Be given hormone replacement therapy (HRT) to treat symptoms of menopause. °Follow these instructions at home: °Lifestyle °· Do not use any products that contain nicotine or tobacco, such as cigarettes, e-cigarettes, and chewing tobacco. If you need help quitting, ask your health care provider. °· Do not use street drugs. °· Do not share needles. °· Ask your health care provider for help if you need support or information about quitting drugs. °Alcohol use °· Do not drink alcohol if: °? Your health care provider tells you not to drink. °? You are pregnant, may be pregnant, or are planning to become pregnant. °· If you drink alcohol: °? Limit how much you use to 0-1 drink a day. °? Limit intake if you are breastfeeding. °· Be aware of how much alcohol is in  your drink. In the [place] one drink equals one 12 oz bottle of beer (355 mL), one 5 oz glass of wine (148 mL), or one 1½ oz glass of hard liquor (44 mL). °General instructions °· Schedule regular health, dental, and eye exams. °· Stay current with your vaccines. °· Tell your health care provider if: °? You often feel depressed. °? You have ever been abused or do not feel safe at home. °Summary °· Adopting a healthy lifestyle and getting preventive care are important in promoting health and wellness. °· Follow your health care provider's instructions about healthy diet, exercising, and getting tested or screened for diseases. °· Follow your health care provider's instructions on monitoring your cholesterol and blood pressure. °This information is not intended to replace advice given to you by your health care provider. Make sure you discuss any questions you have with your health care provider. °Document Revised: 11/03/2018 Document Reviewed: 11/03/2018 °Elsevier Patient Education © 2020 [employer] ° °

## 2020-07-29 NOTE — Progress Notes (Signed)
Subjective:    Patient ID: Summer Anderson, female    DOB: [birth year], 39 y.o.   MRN: [id number]  HPI She is here for a physical exam.     Medications and allergies reviewed with patient and updated if appropriate.  Patient Active Problem List   Diagnosis Date Noted  . SVT (supraventricular tachycardia) (HCC) 07/25/2019  . Right knee pain 07/25/2019  . Abnormal breast biopsy 06/13/2016  . Anxiety 06/13/2016  . Patella-femoral syndrome 06/11/2015  . Migraines     Current Outpatient Medications on File Prior to Visit  Medication Sig Dispense Refill  . amitriptyline (ELAVIL) 25 MG tablet TAKE 1 TABLET AT BEDTIME 90 tablet 1  . aspirin-acetaminophen-caffeine (EXCEDRIN MIGRAINE) 250-250-65 MG tablet Take 1 tablet by mouth every 6 (six) hours as needed for headache. 30 tablet 0  . Biotin 96789 MCG TABS Take by mouth daily.    . Cholecalciferol (VITAMIN D-3) 5000 units TABS Take 5,000 Units by mouth daily.    . Diclofenac Sodium (PENNSAID) 2 % SOLN Place 2 g onto the skin 2 (two) times daily. 112 g 3  . Fremanezumab-vfrm (AJOVY) 225 MG/1.5ML SOAJ Inject 225 mg into the skin every 30 (thirty) days. 1 pen 11  . levonorgestrel (MIRENA) 20 MCG/24HR IUD 1 Intra Uterine Device (1 each total) by Intrauterine route once. 1 each 0  . ondansetron (ZOFRAN-ODT) 4 MG disintegrating tablet Take 1-2 tablets (4-8 mg total) by mouth every 8 (eight) hours as needed for nausea. 30 tablet 3  . PARoxetine (PAXIL) 10 MG tablet TAKE 1 TABLET DAILY 90 tablet 1  . Rimegepant Sulfate (NURTEC) 75 MG TBDP Take 75 mg by mouth every other day. For migraines. 16 tablet 11   No current facility-administered medications on file prior to visit.    Past Medical History:  Diagnosis Date  . Abnormal breast biopsy 06/13/2016   Right, PSEUDOANGIOMATOUS STROMAL HYPERPLASIA (PASH). 6 month f/u   . Acute bronchitis 09/18/2017  . Allergy   . Anxiety 06/13/2016  . Chronic headaches   . Depression    hx of, no medications  since 12/15  . Kidney stones 2004  . Migraines    Started in HS Aura, nausea MRI normal Did not tolerate topamax in the past Excedrin migraine as needed No known triggers, can have a few a week or none for a couple of weeks   . Patella-femoral syndrome 06/11/2015  . Rash 01/28/2018  . Right knee pain 07/25/2019  . Sore throat 01/28/2018  . Tachycardia 07/25/2019   sinus tachycardia  . Weight gain 09/22/2014    Past Surgical History:  Procedure Laterality Date  . BREAST BIOPSY Right    PSEUDOANGIOMATOUS STROMAL HYPERPLASIA (PASH).  Marland Kitchen BREAST ENHANCEMENT SURGERY  12/2013  . EP study  2006   performed in [place],  no inducible arrhythmias per patient    Social History   Socioeconomic History  . Marital status: Married    Spouse name: Not on file  . Number of children: 2  . Years of education: Not on file  . Highest education level: Bachelor's degree (e.g., BA, AB, BS)  Occupational History  . Not on file  Tobacco Use  . Smoking status: Never Smoker  . Smokeless tobacco: Never Used  Vaping Use  . Vaping Use: Never used  Substance and Sexual Activity  . Alcohol use: Yes    Alcohol/week: 0.0 standard drinks    Comment: Occasionally   . Drug use: No  . Sexual activity: Not on  file  Other Topics Concern  . 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   She lives with husband and two daughters.    Exercise:  running   Right handed   Caffeine: 1 cup coffee/day   Social Determinants of Health   Financial Resource Strain:   . Difficulty of Paying Living Expenses: Not on file  Food Insecurity:   . Worried About Programme researcher, broadcasting/film/video in the Last Year: Not on file  . Ran Out of Food in the Last Year: Not on file  Transportation Needs:   . Lack of Transportation (Medical): Not on file  . Lack of Transportation (Non-Medical): Not on file  Physical Activity:   . Days of Exercise per Week: Not on file  . Minutes of  Exercise per Session: Not on file  Stress:   . Feeling of Stress : Not on file  Social Connections:   . Frequency of Communication with Friends and Family: Not on file  . Frequency of Social Gatherings with Friends and Family: Not on file  . Attends Religious Services: Not on file  . Active Member of Clubs or Organizations: Not on file  . Attends Banker Meetings: Not on file  . Marital Status: Not on file    Family History  Problem Relation Age of Onset  . Acute myelogenous leukemia Father 52  . Healthy Mother   . Migraines Sister   . Parkinson's disease Maternal Grandfather        uncetain dx  . Healthy Brother   . Healthy Sister   . Healthy Daughter   . Migraines Daughter     Review of Systems     Objective:  There were no vitals filed for this visit. There were no vitals filed for this visit. There is no height or weight on file to calculate BMI.  BP Readings from Last 3 Encounters:  06/04/20 112/80  01/30/20 115/65  09/26/19 140/70    Wt Readings from Last 3 Encounters:  06/04/20 142 lb (64.4 kg)  01/30/20 140 lb (63.5 kg)  10/07/19 137 lb (62.1 kg)     Physical Exam Constitutional: She appears well-developed and well-nourished. No distress.  HENT:  Head: Normocephalic and atraumatic.  Right Ear: External ear normal. Normal ear canal and TM Left Ear: External ear normal.  Normal ear canal and TM Mouth/Throat: Oropharynx is clear and moist.  Eyes: Conjunctivae and EOM are normal.  Neck: Neck supple. No tracheal deviation present. No thyromegaly present.  No carotid bruit  Cardiovascular: Normal rate, regular rhythm and normal heart sounds.   No murmur heard.  No edema. Pulmonary/Chest: Effort normal and breath sounds normal. No respiratory distress. She has no wheezes. She has no rales.  Breast: deferred   Abdominal: Soft. She exhibits no distension. There is no tenderness.  Lymphadenopathy: She has no cervical adenopathy.  Skin: Skin is  warm and dry. She is not diaphoretic.  Psychiatric: She has a normal mood and affect. Her behavior is normal.        Assessment & Plan:   Physical exam: Screening blood work    ordered Immunizations  Had covid, will get flu at work Gyn  Up to date  Exercise   Weight   Substance abuse  none  See Problem List for Assessment and Plan of chronic medical problems.   This visit occurred during the SARS-CoV-2 public health emergency.  Safety protocols were in place, including screening questions prior to the visit, additional usage of staff PPE, and extensive cleaning of exam room while observing appropriate contact time as indicated for disinfecting solutions.    This encounter was created in error - please disregard.

## 2020-07-31 ENCOUNTER — Encounter: Payer: Self-pay | Admitting: Internal Medicine

## 2020-07-31 ENCOUNTER — Encounter: Payer: 59 | Admitting: Internal Medicine

## 2020-08-13 ENCOUNTER — Other Ambulatory Visit: Payer: Self-pay

## 2020-08-13 ENCOUNTER — Ambulatory Visit
Admission: RE | Admit: 2020-08-13 | Discharge: 2020-08-13 | Disposition: A | Discharge status: Home / Self Care | Payer: 59 | Source: Ambulatory Visit | Attending: Obstetrics and Gynecology | Admitting: Obstetrics and Gynecology

## 2020-08-13 ENCOUNTER — Other Ambulatory Visit: Payer: Self-pay | Admitting: Obstetrics and Gynecology

## 2020-08-13 DIAGNOSIS — Z1231 Encounter for screening mammogram for malignant neoplasm of breast: Secondary | ICD-10-CM

## 2020-08-30 NOTE — Progress Notes (Signed)
Subjective:    Patient ID: Summer Anderson, female    DOB: Mar 27, 1981, 39 y.o.   MRN: [id number]  HPI She is here for a physical exam.   Overall doing well and has no concerns.   Medications and allergies reviewed with patient and updated if appropriate.  Patient Active Problem List   Diagnosis Date Noted  . SVT (supraventricular tachycardia) (HCC) 07/25/2019  . Abnormal breast biopsy 06/13/2016  . Anxiety 06/13/2016  . Patella-femoral syndrome 06/11/2015  . Migraines     Current Outpatient Medications on File Prior to Visit  Medication Sig Dispense Refill  . aspirin-acetaminophen-caffeine (EXCEDRIN MIGRAINE) 250-250-65 MG tablet Take 1 tablet by mouth every 6 (six) hours as needed for headache. 30 tablet 0  . Biotin 84132 MCG TABS Take by mouth daily.    . Cholecalciferol (VITAMIN D-3) 5000 units TABS Take 5,000 Units by mouth daily.    . Diclofenac Sodium (PENNSAID) 2 % SOLN Place 2 g onto the skin 2 (two) times daily. 112 g 3  . Fremanezumab-vfrm (AJOVY) 225 MG/1.5ML SOAJ Inject 225 mg into the skin every 30 (thirty) days. 1 pen 11  . levonorgestrel (MIRENA) 20 MCG/24HR IUD 1 Intra Uterine Device (1 each total) by Intrauterine route once. 1 each 0  . ondansetron (ZOFRAN-ODT) 4 MG disintegrating tablet Take 1-2 tablets (4-8 mg total) by mouth every 8 (eight) hours as needed for nausea. 30 tablet 3  . PARoxetine (PAXIL) 10 MG tablet TAKE 1 TABLET DAILY 90 tablet 1  . Rimegepant Sulfate (NURTEC) 75 MG TBDP Take 75 mg by mouth every other day. For migraines. 16 tablet 11   No current facility-administered medications on file prior to visit.    Past Medical History:  Diagnosis Date  . Abnormal breast biopsy 06/13/2016   Right, PSEUDOANGIOMATOUS STROMAL HYPERPLASIA (PASH). 6 month f/u   . Acute bronchitis 09/18/2017  . Allergy   . Anxiety 06/13/2016  . Chronic headaches   . Depression    hx of, no medications since 12/15  . Kidney stones 2004  . Migraines    Started  in HS Aura, nausea MRI normal Did not tolerate topamax in the past Excedrin migraine as needed No known triggers, can have a few a week or none for a couple of weeks   . Patella-femoral syndrome 06/11/2015  . Rash 01/28/2018  . Right knee pain 07/25/2019  . Sore throat 01/28/2018  . Tachycardia 07/25/2019   sinus tachycardia  . Weight gain 09/22/2014    Past Surgical History:  Procedure Laterality Date  . BREAST BIOPSY Right    PSEUDOANGIOMATOUS STROMAL HYPERPLASIA (PASH).  Marland Kitchen BREAST ENHANCEMENT SURGERY  12/2013  . EP study  2006   performed in [place],  no inducible arrhythmias per patient    Social History   Socioeconomic History  . Marital status: Married    Spouse name: Not on file  . Number of children: 2  . Years of education: Not on file  . Highest education level: Bachelor's degree (e.g., BA, AB, BS)  Occupational History  . Not on file  Tobacco Use  . Smoking status: Never Smoker  . Smokeless tobacco: Never Used  Vaping Use  . Vaping Use: Never used  Substance and Sexual Activity  . Alcohol use: Yes    Alcohol/week: 0.0 standard drinks    Comment: Occasionally   . Drug use: No  . Sexual activity: Not on file  Other Topics Concern  . 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   She lives with husband and two daughters.    Exercise:  running   Right handed   Caffeine: 1 cup coffee/day   Social Determinants of Health   Financial Resource Strain:   . Difficulty of Paying Living Expenses: Not on file  Food Insecurity:   . Worried About Programme researcher, broadcasting/film/video in the Last Year: Not on file  . Ran Out of Food in the Last Year: Not on file  Transportation Needs:   . Lack of Transportation (Medical): Not on file  . Lack of Transportation (Non-Medical): Not on file  Physical Activity:   . Days of Exercise per Week: Not on file  . Minutes of Exercise per Session: Not on file  Stress:   . Feeling of  Stress : Not on file  Social Connections:   . Frequency of Communication with Friends and Family: Not on file  . Frequency of Social Gatherings with Friends and Family: Not on file  . Attends Religious Services: Not on file  . Active Member of Clubs or Organizations: Not on file  . Attends Banker Meetings: Not on file  . Marital Status: Not on file    Family History  Problem Relation Age of Onset  . Acute myelogenous leukemia Father 3  . Healthy Mother   . Migraines Sister   . Parkinson's disease Maternal Grandfather        uncetain dx  . Healthy Brother   . Healthy Sister   . Healthy Daughter   . Migraines Daughter     Review of Systems  Constitutional: Negative for chills and fever.  Eyes: Negative for visual disturbance.  Respiratory: Negative for cough, shortness of breath and wheezing.   Cardiovascular: Negative for chest pain, palpitations and leg swelling.  Gastrointestinal: Negative for abdominal pain, blood in stool, constipation, diarrhea and nausea.  Genitourinary: Negative for dysuria and hematuria.  Musculoskeletal: Negative for arthralgias and back pain.  Skin: Negative for color change and rash.  Neurological: Positive for headaches. Negative for dizziness and light-headedness.  Psychiatric/Behavioral: Negative for dysphoric mood. The patient is nervous/anxious.        Objective:   Vitals:   08/31/20 0808  BP: 116/82  Pulse: (!) 104  Temp: 98.1 F (36.7 C)  SpO2: 96%   Filed Weights   08/31/20 0808  Weight: 144 lb 12.8 oz (65.7 kg)   Body mass index is 24.85 kg/m.  BP Readings from Last 3 Encounters:  08/31/20 116/82  06/04/20 112/80  01/30/20 115/65    Wt Readings from Last 3 Encounters:  08/31/20 144 lb 12.8 oz (65.7 kg)  06/04/20 142 lb (64.4 kg)  01/30/20 140 lb (63.5 kg)    Depression screen PHQ 2/9 08/31/2020  Decreased Interest 0  Down, Depressed, Hopeless 0  PHQ - 2 Score 0     Physical Exam Constitutional:  She appears well-developed and well-nourished. No distress.  HENT:  Head: Normocephalic and atraumatic.  Right Ear: External ear normal. Normal ear canal and TM Left Ear: External ear normal.  Normal ear canal and TM Mouth/Throat: Oropharynx is clear and moist.  Eyes: Conjunctivae and EOM are normal.  Neck: Neck supple. No tracheal deviation present. No thyromegaly present.  No carotid bruit  Cardiovascular: Normal rate, regular rhythm and normal heart sounds.   No murmur heard.  No edema. Pulmonary/Chest: Effort normal and breath sounds normal. No respiratory distress.  She has no wheezes. She has no rales.  Breast: deferred   Abdominal: Soft. She exhibits no distension. There is no tenderness.  Lymphadenopathy: She has no cervical adenopathy.  Skin: Skin is warm and dry. She is not diaphoretic.  Psychiatric: She has a normal mood and affect. Her behavior is normal.        Assessment & Plan:   Physical exam: Screening blood work    ordered Immunizations  Had covid, flu today Gyn  Up to date  Exercise  regular Weight   normal Substance abuse  none   Screened for depression using the PHQ 2 scale.  No evidence of depression.     See Problem List for Assessment and Plan of chronic medical problems.   This visit occurred during the SARS-CoV-2 public health emergency.  Safety protocols were in place, including screening questions prior to the visit, additional usage of staff PPE, and extensive cleaning of exam room while observing appropriate contact time as indicated for disinfecting solutions.

## 2020-08-30 NOTE — Patient Instructions (Addendum)
Blood work was ordered.    All other Health Maintenance issues reviewed.   All recommended immunizations and age-appropriate screenings are up-to-date or discussed.  Flu immunization administered today.    Medications reviewed and updated.  Changes include :   Stop the amitriptyline.  Increase the paxil to 20 mg if needed.        Please followup in 1 year    Health Maintenance, Female Adopting a healthy lifestyle and getting preventive care are important in promoting health and wellness. Ask your health care provider about:  The right schedule for you to have regular tests and exams.  Things you can do on your own to prevent diseases and keep yourself healthy. What should I know about diet, weight, and exercise? Eat a healthy diet   Eat a diet that includes plenty of vegetables, fruits, low-fat dairy products, and lean protein.  Do not eat a lot of foods that are high in solid fats, added sugars, or sodium. Maintain a healthy weight Body mass index (BMI) is used to identify weight problems. It estimates body fat based on height and weight. Your health care provider can help determine your BMI and help you achieve or maintain a healthy weight. Get regular exercise Get regular exercise. This is one of the most important things you can do for your health. Most adults should:  Exercise for at least 150 minutes each week. The exercise should increase your heart rate and make you sweat (moderate-intensity exercise).  Do strengthening exercises at least twice a week. This is in addition to the moderate-intensity exercise.  Spend less time sitting. Even light physical activity can be beneficial. Watch cholesterol and blood lipids Have your blood tested for lipids and cholesterol at 40 years of age, then have this test every 5 years. Have your cholesterol levels checked more often if:  Your lipid or cholesterol levels are high.  You are older than 40 years of age.  You are at  high risk for heart disease. What should I know about cancer screening? Depending on your health history and family history, you may need to have cancer screening at various ages. This may include screening for:  Breast cancer.  Cervical cancer.  Colorectal cancer.  Skin cancer.  Lung cancer. What should I know about heart disease, diabetes, and high blood pressure? Blood pressure and heart disease  High blood pressure causes heart disease and increases the risk of stroke. This is more likely to develop in people who have high blood pressure readings, are of African descent, or are overweight.  Have your blood pressure checked: ? Every 3-5 years if you are 16-61 years of age. ? Every year if you are 75 years old or older. Diabetes Have regular diabetes screenings. This checks your fasting blood sugar level. Have the screening done:  Once every three years after age 39 if you are at a normal weight and have a low risk for diabetes.  More often and at a younger age if you are overweight or have a high risk for diabetes. What should I know about preventing infection? Hepatitis B If you have a higher risk for hepatitis B, you should be screened for this virus. Talk with your health care provider to find out if you are at risk for hepatitis B infection. Hepatitis C Testing is recommended for:  Everyone born from [birth year] through 1966.  Anyone with known risk factors for hepatitis C. Sexually transmitted infections (STIs)  Get screened for STIs, including  gonorrhea and chlamydia, if: ? You are sexually active and are younger than 40 years of age. ? You are older than 40 years of age and your health care provider tells you that you are at risk for this type of infection. ? Your sexual activity has changed since you were last screened, and you are at increased risk for chlamydia or gonorrhea. Ask your health care provider if you are at risk.  Ask your health care provider about whether  you are at high risk for HIV. Your health care provider may recommend a prescription medicine to help prevent HIV infection. If you choose to take medicine to prevent HIV, you should first get tested for HIV. You should then be tested every 3 months for as long as you are taking the medicine. Pregnancy  If you are about to stop having your period (premenopausal) and you may become pregnant, seek counseling before you get pregnant.  Take 400 to 800 micrograms (mcg) of folic acid every day if you become pregnant.  Ask for birth control (contraception) if you want to prevent pregnancy. Osteoporosis and menopause Osteoporosis is a disease in which the bones lose minerals and strength with aging. This can result in bone fractures. If you are 67 years old or older, or if you are at risk for osteoporosis and fractures, ask your health care provider if you should:  Be screened for bone loss.  Take a calcium or vitamin D supplement to lower your risk of fractures.  Be given hormone replacement therapy (HRT) to treat symptoms of menopause. Follow these instructions at home: Lifestyle  Do not use any products that contain nicotine or tobacco, such as cigarettes, e-cigarettes, and chewing tobacco. If you need help quitting, ask your health care provider.  Do not use street drugs.  Do not share needles.  Ask your health care provider for help if you need support or information about quitting drugs. Alcohol use  Do not drink alcohol if: ? Your health care provider tells you not to drink. ? You are pregnant, may be pregnant, or are planning to become pregnant.  If you drink alcohol: ? Limit how much you use to 0-1 drink a day. ? Limit intake if you are breastfeeding.  Be aware of how much alcohol is in your drink. In the [place] one drink equals one 12 oz bottle of beer (355 mL), one 5 oz glass of wine (148 mL), or one 1 oz glass of hard liquor (44 mL). General instructions  Schedule regular  health, dental, and eye exams.  Stay current with your vaccines.  Tell your health care provider if: ? You often feel depressed. ? You have ever been abused or do not feel safe at home. Summary  Adopting a healthy lifestyle and getting preventive care are important in promoting health and wellness.  Follow your health care provider's instructions about healthy diet, exercising, and getting tested or screened for diseases.  Follow your health care provider's instructions on monitoring your cholesterol and blood pressure. This information is not intended to replace advice given to you by your health care provider. Make sure you discuss any questions you have with your health care provider. Document Revised: 11/03/2018 Document Reviewed: 11/03/2018 Elsevier Patient Education  2020 [employer].

## 2020-08-31 ENCOUNTER — Encounter: Payer: Self-pay | Admitting: Internal Medicine

## 2020-08-31 ENCOUNTER — Other Ambulatory Visit: Payer: Self-pay

## 2020-08-31 ENCOUNTER — Ambulatory Visit (INDEPENDENT_AMBULATORY_CARE_PROVIDER_SITE_OTHER): Payer: 59 | Admitting: Internal Medicine

## 2020-08-31 VITALS — BP 116/82 | HR 104 | Temp 98.1°F | Ht 64.0 in | Wt 144.8 lb

## 2020-08-31 DIAGNOSIS — F419 Anxiety disorder, unspecified: Secondary | ICD-10-CM | POA: Diagnosis not present

## 2020-08-31 DIAGNOSIS — Z23 Encounter for immunization: Secondary | ICD-10-CM | POA: Diagnosis not present

## 2020-08-31 DIAGNOSIS — Z0000 Encounter for general adult medical examination without abnormal findings: Secondary | ICD-10-CM | POA: Diagnosis not present

## 2020-08-31 DIAGNOSIS — G43109 Migraine with aura, not intractable, without status migrainosus: Secondary | ICD-10-CM

## 2020-08-31 LAB — COMPREHENSIVE METABOLIC PANEL
ALT: 13 U/L (ref 0–35)
AST: 14 U/L (ref 0–37)
Albumin: 4.5 g/dL (ref 3.5–5.2)
Alkaline Phosphatase: 41 U/L (ref 39–117)
BUN: 16 mg/dL (ref 6–23)
CO2: 28 mEq/L (ref 19–32)
Calcium: 9.6 mg/dL (ref 8.4–10.5)
Chloride: 104 mEq/L (ref 96–112)
Creatinine, Ser: 0.8 mg/dL (ref 0.40–1.20)
GFR: 92.19 mL/min (ref >60.00)
Glucose, Bld: 85 mg/dL (ref 70–99)
Potassium: 4.3 mEq/L (ref 3.5–5.1)
Sodium: 138 mEq/L (ref 135–145)
Total Bilirubin: 0.6 mg/dL (ref 0.2–1.2)
Total Protein: 7.4 g/dL (ref 6.0–8.3)

## 2020-08-31 LAB — CBC WITH DIFFERENTIAL/PLATELET
Basophils Absolute: 0 10*3/uL (ref 0.0–0.1)
Basophils Relative: 0.6 % (ref 0.0–3.0)
Eosinophils Absolute: 0 10*3/uL (ref 0.0–0.7)
Eosinophils Relative: 0.5 % (ref 0.0–5.0)
HCT: 38.5 % (ref 36.0–46.0)
Hemoglobin: 13.1 g/dL (ref 12.0–15.0)
Lymphocytes Relative: 31.8 % (ref 12.0–46.0)
Lymphs Abs: 1.7 10*3/uL (ref 0.7–4.0)
MCHC: 34.1 g/dL (ref 30.0–36.0)
MCV: 97.5 fl (ref 78.0–100.0)
Monocytes Absolute: 0.5 10*3/uL (ref 0.1–1.0)
Monocytes Relative: 9.1 % (ref 3.0–12.0)
Neutro Abs: 3 10*3/uL (ref 1.4–7.7)
Neutrophils Relative %: 58 % (ref 43.0–77.0)
Platelets: 225 10*3/uL (ref 150.0–400.0)
RBC: 3.95 Mil/uL (ref 3.87–5.11)
RDW: 11.6 % (ref 11.5–15.5)
WBC: 5.2 10*3/uL (ref 4.0–10.5)

## 2020-08-31 LAB — LIPID PANEL
Cholesterol: 228 mg/dL — ABNORMAL HIGH (ref 0–200)
HDL: 53.7 mg/dL (ref >39.00)
LDL Cholesterol: 157 mg/dL — ABNORMAL HIGH (ref 0–99)
NonHDL: 174.69
Total CHOL/HDL Ratio: 4
Triglycerides: 89 mg/dL (ref 0.0–149.0)
VLDL: 17.8 mg/dL (ref 0.0–40.0)

## 2020-08-31 LAB — TSH: TSH: 0.97 u[IU]/mL (ref 0.35–4.50)

## 2020-08-31 NOTE — Assessment & Plan Note (Addendum)
Chronic Currently well controlled Will d/c amitriptyline  Continue paxil 10 mg daily -- may need to increase to 20 mg since stopping amitriptyline - she will let me know

## 2020-08-31 NOTE — Assessment & Plan Note (Signed)
Chronic Seeing Dr Lucia Gaskins On ajovy Q 3 weeks and on amitriptyline at night Occasional break through Uses nurtec as needed Overall well controlled

## 2020-10-11 ENCOUNTER — Encounter: Payer: Self-pay | Admitting: Internal Medicine

## 2020-10-12 MED ORDER — PAROXETINE HCL 20 MG PO TABS: 20.0000 mg | ORAL_TABLET | Freq: Every day | ORAL | 3 refills | Status: DC

## 2021-01-07 ENCOUNTER — Other Ambulatory Visit: Payer: Self-pay | Admitting: Neurology

## 2021-01-07 DIAGNOSIS — G43709 Chronic migraine without aura, not intractable, without status migrainosus: Secondary | ICD-10-CM

## 2021-01-07 MED ORDER — EMGALITY 120 MG/ML ~~LOC~~ SOAJ: 120.0000 mg | SUBCUTANEOUS | 11 refills | Status: DC

## 2021-01-08 ENCOUNTER — Other Ambulatory Visit: Payer: Self-pay | Admitting: Neurology

## 2021-01-08 MED ORDER — UBRELVY 100 MG PO TABS: 100.0000 mg | ORAL_TABLET | ORAL | 11 refills | Status: DC | PRN

## 2021-03-07 ENCOUNTER — Telehealth: Payer: Self-pay

## 2021-03-07 NOTE — Telephone Encounter (Signed)
I have submitted a PA request for [place] on [facility], [place]: [id number].   Awaiting determination.

## 2021-03-12 NOTE — Telephone Encounter (Signed)
Pt should be able to use the savings card for this medication.

## 2021-03-12 NOTE — Telephone Encounter (Signed)
Ubrelvy denied.  You do not meet the requirements of your plan. Your plan covers this drug when you are not using it with another CGRP receptor antagonist. Your request has been denied based on the information we have.  If you disagree with this decision, you may ask for an appeal. Please mail or fax your appeal to: Prescription Claim Appeals [facility] - [facility] [address] [address] [address], [place] [address] Fax: [phone]  PA# [employer] [id number] LN Plan-approved Criteria: CGRP Receptor Antagonists Oral Step Therapy

## 2021-03-13 ENCOUNTER — Other Ambulatory Visit: Payer: Self-pay | Admitting: Neurology

## 2021-03-13 DIAGNOSIS — G43709 Chronic migraine without aura, not intractable, without status migrainosus: Secondary | ICD-10-CM

## 2021-03-13 MED ORDER — EMGALITY 120 MG/ML ~~LOC~~ SOAJ
120.0000 mg | SUBCUTANEOUS | 11 refills | Status: DC
Start: 2021-03-13 — End: 2021-08-05

## 2021-06-04 ENCOUNTER — Ambulatory Visit (INDEPENDENT_AMBULATORY_CARE_PROVIDER_SITE_OTHER): Payer: 59 | Admitting: Adult Health

## 2021-06-04 ENCOUNTER — Encounter: Payer: Self-pay | Admitting: Adult Health

## 2021-06-04 ENCOUNTER — Telehealth: Payer: Self-pay | Admitting: *Deleted

## 2021-06-04 VITALS — BP 123/80 | HR 82 | Ht 64.0 in | Wt 142.4 lb

## 2021-06-04 DIAGNOSIS — G43709 Chronic migraine without aura, not intractable, without status migrainosus: Secondary | ICD-10-CM

## 2021-06-04 NOTE — Telephone Encounter (Signed)
Botox charge sheet completed, pending NP signature.

## 2021-06-04 NOTE — Patient Instructions (Signed)
Stop Emgality- ok to take dose this week Continue Nurtec Start botox

## 2021-06-04 NOTE — Progress Notes (Addendum)
PATIENT: Summer Anderson DOB: Mar 04, 1981  REASON FOR VISIT: follow up HISTORY FROM: patient PRIMARY NEUROLOGIST: Dr. Jaynee Eagles  HISTORY OF PRESENT ILLNESS: Today 06/04/21:  [name] is a 40 year old female with a history of migraine headaches.  She returns today for follow-up.  She is currently on Emgality.  Reports that it works well for 2 weeks and then the last 2 weeks a month she had several headaches.  Reports that her headaches can last up to 6 hours.  She does take Nurtec and it does help some but does not always resolve the headache.  She has tried other abortive therapies in the past but has found that Nurtec works the best for her.  She has tried Ajovy and Aimovig without benefit.  The patient is interested in trying another preventative therapy.  HISTORY June 04, 2020: Patient reports she is doing much better, she is on Ajovy and is helping reduce the frequency, weeks 1-3 the past then she can feel its time for the shot the last week and she will get 2-3 migraines, some do not last long, others may last a day, Nurtec is the best as needed medicine she has used for migraines.We will give her some samples so she can take them every 3 weeks. We discussed Nurtec as a preventative. We will try and get this for her as 16 a month. We also discussed she can pre-treat with nurtec such as prior to her period, or if she thinks she may have a migraine or is having a migraine prodrome.   HPI:  Summer Anderson is a 40 y.o. female here as requested by Binnie Rail, MD for migraines. PMHx anxiety, chronic headaches, depression, kidney stones, sinus tachycardia. Migraines started in HS. I reviewed records and meds, appears she was started on Aimovig recently and also tried several other medications in the past. She has had migraines since HS. They are usually on the left but sometimes on the right periorbital ut more upper forehead, she vomits, she has nausea, she cannot figure out triggers, she has  mirena, she did a food and sleep diary and never found triggers, sister has migraines too. Also her 60 year old has them now. They are pounding, light and sound sensitivity, she has tried multiple triptans and maxalt works but she feels like she is in a fog. Side effects to the medication. Excedrin migraine works. 23 headache days a month, she is not going to have children and has mirena. No aura. No medication overuse. She has 10-12 moderate to severe migraine days a month that can last 24-72 hours. She has new symptoms, the migraines are waking her up and worse within the last year. The severity and frequency are stable but the duration is worsening and longer. She feels the aimovig is wearing. No other focal neurologic deficits, associated symptoms, inciting events or modifiable factors.   Reviewed notes, labs and imaging from outside physicians, which showed:   Meds tried: topamax, elavil, excedrin prn, diclofenac,paxil, maxalt, aimovig, relpax, lexapro, magnesium, propranolol, zomig, zonegran   06/2019: TSH 1.62 normal. Cbc unremarkable, cmp normal   MRI brain 2014: showed No acute intracranial abnormalities including mass lesion or mass effect, hydrocephalus, extra-axial fluid collection, midline shift, hemorrhage, or acute infarction, large ischemic events (personally reviewed images)    REVIEW OF SYSTEMS: Out of a complete 14 system review of symptoms, the patient complains only of the following symptoms, and all other reviewed systems are negative.  ALLERGIES: Allergies  Allergen Reactions   Oxycodone-Acetaminophen Nausea Only    HOME MEDICATIONS: Outpatient Medications Prior to Visit  Medication Sig Dispense Refill   aspirin-acetaminophen-caffeine (EXCEDRIN MIGRAINE) 250-250-65 MG tablet Take 1 tablet by mouth every 6 (six) hours as needed for headache. 30 tablet 0   Biotin 10000 MCG TABS Take by mouth daily.     Cholecalciferol (VITAMIN D-3) 5000 units TABS Take 5,000 Units by mouth  daily.     Diclofenac Sodium (PENNSAID) 2 % SOLN Place 2 g onto the skin 2 (two) times daily. 112 g 3   Galcanezumab-gnlm (EMGALITY) 120 MG/ML SOAJ Inject 120 mg into the skin every 21 ( twenty-one) days. 1 mL 11   levonorgestrel (MIRENA) 20 MCG/24HR IUD 1 Intra Uterine Device (1 each total) by Intrauterine route once. 1 each 0   ondansetron (ZOFRAN-ODT) 4 MG disintegrating tablet Take 1-2 tablets (4-8 mg total) by mouth every 8 (eight) hours as needed for nausea. 30 tablet 3   PARoxetine (PAXIL) 20 MG tablet Take 1 tablet (20 mg total) by mouth daily. 90 tablet 3   Rimegepant Sulfate (NURTEC) 75 MG TBDP Take 75 mg by mouth every other day. For migraines. (Patient taking differently: Take 75 mg by mouth every other day. For migraines as needed.) 16 tablet 11   Ubrogepant (UBRELVY) 100 MG TABS Take 100 mg by mouth every 2 (two) hours as needed. Maximum 262m a day. 16 tablet 11   No facility-administered medications prior to visit.    PAST MEDICAL HISTORY: Past Medical History:  Diagnosis Date   Abnormal breast biopsy 06/13/2016   [place]    Acute bronchitis 09/18/2017   Allergy    Anxiety 06/13/2016   Chronic headaches    Depression    hx of, no medications since 12/15   Kidney stones 2004   Migraines    Started in HS Aura, nausea MRI normal Did not tolerate topamax in the past Excedrin migraine as needed No known triggers, can have a few a week or none for a couple of weeks    Patella-femoral syndrome 06/11/2015   Rash 01/28/2018   Right knee pain 07/25/2019   Sore throat 01/28/2018   Tachycardia 07/25/2019   sinus tachycardia   Weight gain 09/22/2014    PAST SURGICAL HISTORY: Past Surgical History:  Procedure Laterality Date   BREAST BIOPSY Right    PSEUDOANGIOMATOUS STROMAL HYPERPLASIA ([place].   BREAST ENHANCEMENT SURGERY  12/2013   [place]  2006   [place]  no inducible [place]ias per patient    FAMILY  HISTORY: Family History  Problem Relation Age of Onset   Acute myelogenous leukemia Father 536  Healthy Mother    Migraines Sister    Parkinson's disease Maternal Grandfather        uncetain dx   Healthy Brother    Healthy Sister    Healthy Daughter    Migraines Daughter     SOCIAL HISTORY: Social History   Socioeconomic History   Marital status: Married    Spouse name: Not on file   Number of children: 2   Years of education: Not on file   Highest education level: Bachelor's degree (e.g., BA, AB, BS)  Occupational History   Not on file  Tobacco Use   Smoking status: Never   Smokeless tobacco: Never  Vaping Use   Vaping Use: Never used  Substance and Sexual Activity   Alcohol use: Yes    Alcohol/week: 0.0  standard drinks    Comment: Occasionally    Drug use: No   Sexual activity: Not on file  Other Topics Concern   Not on file  Social History Narrative   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   She lives with husband and two daughters.    Exercise:  running   Right handed   Caffeine: 1 cup coffee/day   Social Determinants of Health   Financial Resource Strain: Not on file  Food Insecurity: Not on file  Transportation Needs: Not on file  Physical Activity: Not on file  Stress: Not on file  Social Connections: Not on file  Intimate Partner Violence: Not on file      PHYSICAL EXAM  Vitals:   06/04/21 0815  BP: 123/80  Pulse: 82  Weight: 142 lb 6.4 oz (64.6 kg)  Height: _0  (1.626 m)   Body mass index is 24.44 kg/m.  Generalized: Well developed, in no acute distress   Neurological examination  Mentation: Alert oriented to time, place, history taking. Follows all commands speech and language fluent Cranial nerve II-XII: Pupils were equal round reactive to light. Extraocular movements were full, visual field were full on confrontational test. Facial sensation and strength were normal. Uvula tongue midline. Head turning  and shoulder shrug  were normal and symmetric. Motor: The motor testing reveals 5 over 5 strength of all 4 extremities. Good symmetric motor tone is noted throughout.  Sensory: Sensory testing is intact to soft touch on all 4 extremities. No evidence of extinction is noted.  Coordination: Cerebellar testing reveals good finger-nose-finger and heel-to-shin bilaterally.  Gait and station: Gait is normal.  Reflexes: Deep tendon reflexes are symmetric and normal bilaterally.   DIAGNOSTIC DATA (LABS, IMAGING, TESTING) - I reviewed patient records, labs, notes, testing and imaging myself where available.  Lab Results  Component Value Date   WBC 5.2 08/31/2020   HGB 13.1 08/31/2020   HCT 38.5 08/31/2020   MCV 97.5 08/31/2020   PLT 225.0 08/31/2020      Component Value Date/Time   NA 138 08/31/2020 0843   K 4.3 08/31/2020 0843   CL 104 08/31/2020 0843   CO2 28 08/31/2020 0843   GLUCOSE 85 08/31/2020 0843   BUN 16 08/31/2020 0843   CREATININE 0.80 08/31/2020 0843   CALCIUM 9.6 08/31/2020 0843   PROT 7.4 08/31/2020 0843   ALBUMIN 4.5 08/31/2020 0843   AST 14 08/31/2020 0843   ALT 13 08/31/2020 0843   ALKPHOS 41 08/31/2020 0843   BILITOT 0.6 08/31/2020 0843   GFRNONAA >60 11/16/2007 0600   GFRAA  11/16/2007 0600    >60        The eGFR has been calculated using the MDRD equation. This calculation has not been validated in all clinical   Lab Results  Component Value Date   CHOL 228 (H) 08/31/2020   HDL 53.70 08/31/2020   LDLCALC 157 (H) 08/31/2020   TRIG 89.0 08/31/2020   CHOLHDL 4 08/31/2020   No results found for: HGBA1C Lab Results  Component Value Date   VITAMINB12 386 12/09/2013   Lab Results  Component Value Date   TSH 0.97 08/31/2020      ASSESSMENT AND PLAN 40 y.o. year old female  has a past medical history of Abnormal breast biopsy (06/13/2016), Acute bronchitis (09/18/2017), Allergy, Anxiety (06/13/2016), Chronic headaches, Depression, Kidney stones (2004),  Migraines, Patella-femoral syndrome (06/11/2015), Rash (01/28/2018), Right knee pain (07/25/2019), Sore throat (01/28/2018), Tachycardia (  07/25/2019), and Weight gain (09/22/2014). here with:  1.  Migraine headaches  -Stop Emgality--did advise that it was okay for her to take her injection this week -Start Botox therapy-consent signed today -Continue Nurtec as an abortive therapy -Next appointment will be for Botox injections   Ward Givens, MSN, NP-C 06/04/2021, 8:28 AM [facility] [address] [place], [place] [address] [phone]  agree with assessment and plan thanks     Sarina Ill, [facility]

## 2021-06-10 NOTE — Telephone Encounter (Signed)
Received charge sheet for 200 units of Botox for G43.709. Received signed patient consent form (given to medical records for processing). Patient has [employer]. I will obtain PA & call patient to schedule.

## 2021-06-17 ENCOUNTER — Other Ambulatory Visit: Payer: Self-pay | Admitting: *Deleted

## 2021-06-17 DIAGNOSIS — G43709 Chronic migraine without aura, not intractable, without status migrainosus: Secondary | ICD-10-CM

## 2021-06-17 MED ORDER — NURTEC 75 MG PO TBDP: ORAL_TABLET | ORAL | 11 refills | Status: DC

## 2021-06-17 MED ORDER — BOTOX 200 UNITS IJ SOLR: INTRAMUSCULAR | 3 refills | Status: DC

## 2021-06-17 NOTE — Telephone Encounter (Signed)
I called patient, scheduled her for 1st available on 9/1. I advised I will call if we have a cancellation. Briefly explained Botox Savings Program & specialty pharmacy protocol. Let her know I will be sending her a MyChart message with all details.

## 2021-06-17 NOTE — Telephone Encounter (Signed)
Botox 200 unit order placed per v.o. Butch Penny NP.

## 2021-06-17 NOTE — Addendum Note (Signed)
Addended by: Bertram Savin on: 06/17/2021 03:55 PM   Modules accepted: Orders

## 2021-06-17 NOTE — Telephone Encounter (Signed)
Received approval from [facility]. Completed PA via [facility] portal. PA #[id number] (06/17/21- 12/18/21). Prescription will be sent to [facility].

## 2021-06-17 NOTE — Progress Notes (Signed)
Received rx refill request from [facility] pharmacies for nurtec.

## 2021-06-25 ENCOUNTER — Telehealth: Payer: Self-pay | Admitting: Adult Health

## 2021-06-25 NOTE — Telephone Encounter (Signed)
Mardene Celeste from [employer] called wanting to schedule delivery for BOTOX.

## 2021-07-01 NOTE — Telephone Encounter (Signed)
I returned [facility]'s call and spoke with Lafonda Mosses. Botox TBD 8/10.

## 2021-07-03 NOTE — Telephone Encounter (Signed)
Summer Anderson has an opening on her schedule for tomorrow at 8:00. I called patient and she accepted the appointment.

## 2021-07-03 NOTE — Telephone Encounter (Signed)
Received (1) 200 unit vial of Botox today from [facility]. 

## 2021-07-04 ENCOUNTER — Ambulatory Visit (INDEPENDENT_AMBULATORY_CARE_PROVIDER_SITE_OTHER): Payer: 59 | Admitting: Adult Health

## 2021-07-04 ENCOUNTER — Other Ambulatory Visit: Payer: Self-pay

## 2021-07-04 DIAGNOSIS — G43709 Chronic migraine without aura, not intractable, without status migrainosus: Secondary | ICD-10-CM | POA: Diagnosis not present

## 2021-07-04 NOTE — Progress Notes (Signed)
Botox- 200 units x 1 vial Lot: [id number] Expiration: 2025/01 NDC: 0023-3921-02  Bacteriostatic 0.9% Sodium Chloride- 2mL total Lot: [id number] Expiration: 11/24/2022 NDC: 7078-6754-49  Dx: E01.007. S/P

## 2021-07-04 NOTE — Progress Notes (Signed)
       BOTOX PROCEDURE NOTE FOR MIGRAINE HEADACHE    Contraindications and precautions discussed with patient(above). Aseptic procedure was observed and patient tolerated procedure. Procedure performed by Butch Penny, NP  The condition has existed for more than 6 months, and pt does not have a diagnosis of ALS, Myasthenia Gravis or Lambert-Eaton Syndrome.  Risks and benefits of injections discussed and pt agrees to proceed with the procedure.  Written consent obtained  These injections are medically necessary. This is her first injection cycle. These injections do not cause sedations or hallucinations which the oral therapies may cause.  Indication/Diagnosis: chronic migraine BOTOX(J0585) injection was performed according to protocol by [employer]. 200 units of BOTOX was dissolved into 4 cc NS.   NDC: [address]-75  Type of toxin: Botox  Botox- 200 units x 1 vial Lot: [id number] Expiration: 2025/01 NDC: 0023-3921-02  Bacteriostatic 0.9% Sodium Chloride- 47mL total Lot: [id number] Expiration: 11/24/2022 NDC: 7588-3254-98  Dx: Y64.158.   Description of procedure:  The patient was placed in a sitting position. The standard protocol was used for Botox as follows, with 5 units of Botox injected at each site:   -Procerus muscle, midline injection  -Corrugator muscle, bilateral injection  -Frontalis muscle, bilateral injection, with 2 sites each side, medial injection was performed in the upper one third of the frontalis muscle, in the region vertical from the medial inferior edge of the superior orbital rim. The lateral injection was again in the upper one third of the forehead vertically above the lateral limbus of the cornea, 1.5 cm lateral to the medial injection site.  -Temporalis muscle injection, 4 sites, bilaterally. The first injection was 3 cm above the tragus of the ear, second injection site was 1.5 cm to 3 cm up from the first injection site in line with the tragus of the  ear. The third injection site was 1.5-3 cm forward between the first 2 injection sites. The fourth injection site was 1.5 cm posterior to the second injection site.  -Occipitalis muscle injection, 3 sites, bilaterally. The first injection was done one half way between the occipital protuberance and the tip of the mastoid process behind the ear. The second injection site was done lateral and superior to the first, 1 fingerbreadth from the first injection. The third injection site was 1 fingerbreadth superiorly and medially from the first injection site.  -Cervical paraspinal muscle injection, 2 sites, bilateral knee first injection site was 1 cm from the midline of the cervical spine, 3 cm inferior to the lower border of the occipital protuberance. The second injection site was 1.5 cm superiorly and laterally to the first injection site.  -Trapezius muscle injection was performed at 3 sites, bilaterally. The first injection site was in the upper trapezius muscle halfway between the inflection point of the neck, and the acromion. The second injection site was one half way between the acromion and the first injection site. The third injection was done between the first injection site and the inflection point of the neck.   Will return for repeat injection in 3 months.   A 200 units of Botox was used, any botox not used was wasted. The patient tolerated the procedure well, there were no complications of the above procedure.  Butch Penny, MSN, NP-C 07/04/2021, 8:08 AM [facility] [address] [place], [place] [address] [phone]

## 2021-07-18 ENCOUNTER — Encounter: Payer: Self-pay | Admitting: Internal Medicine

## 2021-07-18 DIAGNOSIS — R42 Dizziness and giddiness: Secondary | ICD-10-CM

## 2021-07-19 ENCOUNTER — Other Ambulatory Visit: Payer: Self-pay

## 2021-07-19 ENCOUNTER — Ambulatory Visit (INDEPENDENT_AMBULATORY_CARE_PROVIDER_SITE_OTHER): Payer: 59 | Admitting: Rehabilitative and Restorative Service Providers"

## 2021-07-19 ENCOUNTER — Encounter: Payer: Self-pay | Admitting: Adult Health

## 2021-07-19 DIAGNOSIS — R42 Dizziness and giddiness: Secondary | ICD-10-CM

## 2021-07-19 NOTE — Patient Instructions (Signed)
Access Code: [email or web address] URL: [email or web address] Date: 07/19/2021 Prepared by: Margretta Ditty  Program Notes Epley's would be indicated if you have no symptoms at rest and dizziness with laying down, tilting your head back, or bending forward.  If you have dizziness at rest, wait until that settles to do exercises.   Exercises Standing Gaze Stabilization with Head Rotation - 2 x daily - 7 x weekly - 2 sets - 1 reps - 30 seconds up to 60 seconds hold Standing Gaze Stabilization with Head Nod - 2 x daily - 7 x weekly - 2 sets - 1 reps - 30-60 seconds hold Seated Cervical Sidebending Stretch - 2 x daily - 7 x weekly - 1 sets - 3 reps - 30 seconds hold Seated Levator Scapulae Stretch - 2 x daily - 7 x weekly - 1 sets - 3 reps - 30 seconds hold

## 2021-07-19 NOTE — Therapy (Addendum)
[facility] [place], [place], [address] Phone: [phone]   Fax:  [phone]  Physical Therapy Evaluation/ Discharge Summary  Patient Details  Name: Summer Anderson MRN: [id number] Date of Birth: 03-15-1981 Referring Provider (PT): Billey Gosling, MD   Encounter Date: 07/19/2021    Patient did not return to PT.  Please refer to initial evaluation for patient status. Thank you for the referral of this patient. Rudell Cobb, MPT   PT End of Session - 07/19/21 1104     Visit Number 1    Number of Visits 4    Date for PT Re-Evaluation 08/18/21    Authorization Type [employer]    PT Start Time 8563    PT Stop Time 1100    PT Time Calculation (min) 45 min             Past Medical History:  Diagnosis Date   Abnormal breast biopsy 06/13/2016   Right, PSEUDOANGIOMATOUS STROMAL HYPERPLASIA ([place]). 6 month f/u    Acute bronchitis 09/18/2017   Allergy    Anxiety 06/13/2016   Chronic headaches    Depression    hx of, no medications since 12/15   Kidney stones 2004   Migraines    Started in HS Aura, nausea MRI normal Did not tolerate topamax in the past Excedrin migraine as needed No known triggers, can have a few a week or none for a couple of weeks    Patella-femoral syndrome 06/11/2015   Rash 01/28/2018   Right knee pain 07/25/2019   Sore throat 01/28/2018   Tachycardia 07/25/2019   sinus tachycardia   Weight gain 09/22/2014    Past Surgical History:  Procedure Laterality Date   BREAST BIOPSY Right    PSEUDOANGIOMATOUS STROMAL HYPERPLASIA ([place]).   BREAST ENHANCEMENT SURGERY  12/2013   EP study  2006   performed in [place],  no inducible arrhythmias per patient    There were no vitals filed for this visit.    Subjective Assessment - 07/19/21 1015     Subjective The patient began with sudden onset of vertigo yesterday.  She started to feel dizziness with movement like a sense of having too much  to drink.  As the evening progresed, her symptoms worsened.  She treated herself with bilateral Epley's last night and has nausea/vomitted.  Symptoms last night were present with sitting still and with movement.  She was fatigued and debilitated last night, but feels significant improvement today.  Certain sudden movements still provoke a sense of dizziness.    Pertinent History migraines    Patient Stated Goals ensure vertigo is resolved    Currently in Pain? No/denies                [facility] PT Assessment - 07/19/21 1020       Assessment   Medical Diagnosis vertigo    Referring Provider (PT) Billey Gosling, MD    Onset Date/Surgical Date 07/18/21      Precautions   Precautions None      Restrictions   Weight Bearing Restrictions No      Balance Screen   Has the patient fallen in the past 6 months No    Has the patient had a decrease in activity level because of a fear of falling?  No    Is the patient reluctant to leave their home because of a fear of falling?  No      Home Environment   Living Environment  Private residence                    Vestibular Assessment - 07/19/21 1021       Vestibular Assessment   General Observation The patient arrives ambulating independently into clinic.  Quality of typical migraine is left frontal focal pain.  Her last migraine was Wednesday night and she needed a rescue medication Wednesday night.  Yesterday she noted occipital pain and neck tightness.      Symptom Behavior   Subjective history of current problem No prior vertigo.  Has recently changed meds for migraines    Type of Dizziness  Unsteady with head/body turns;Vertigo    Frequency of Dizziness constant yesterday; improved today    Duration of Dizziness None at rest today; notes seconds of symptoms if she moves quickly    Symptom Nature Motion provoked   was more constant yesterday at onset   Aggravating Factors Looking up to the ceiling;Turning head quickly     Relieving Factors Head stationary    Progression of Symptoms Better    History of similar episodes None      Oculomotor Exam   Oculomotor Alignment Normal    Ocular ROM normal    Spontaneous Absent    Gaze-induced  Absent    Smooth Pursuits Intact    Saccades Intact    Comment Gets a sensation of black dots with smooth pursuits to the right.  She does not typically get visual aura with migranes. She notes more pronounced black dots out of the R eye during VOR and oculomotor exam.      Vestibulo-Ocular Reflex   VOR 1 Head Only (x 1 viewing) slow VOR x 1 x 8 reps no dizziness noted    Comment head impulse test=mild refixation saccade to the left side, WNLs to the right side.      Positional Testing   Dix-Hallpike Dix-Hallpike Right;Dix-Hallpike Left    Horizontal Canal Testing Horizontal Canal Right;Horizontal Canal Left      Dix-Hallpike Right   Dix-Hallpike Right Duration None    Dix-Hallpike Right Symptoms No nystagmus      Dix-Hallpike Left   Dix-Hallpike Left Duration none    Dix-Hallpike Left Symptoms No nystagmus      Horizontal Canal Right   Horizontal Canal Right Duration none    Horizontal Canal Right Symptoms Normal      Horizontal Canal Left   Horizontal Canal Left Duration none    Horizontal Canal Left Symptoms Normal                Objective measurements completed on examination: See above findings.       [facility] Adult PT Treatment/Exercise - 07/19/21 1115       Exercises   Exercises Neck      Neck Exercises: Stretches   Upper Trapezius Stretch Right;Left;1 rep;30 seconds    Levator Stretch Right;Left;1 rep;30 seconds      Manual Therapy   Manual Therapy Soft tissue mobilization    Soft tissue mobilization suboccipital release and STM scalenes             Vestibular Treatment/Exercise - 07/19/21 1114       Vestibular Treatment/Exercise   Vestibular Treatment Provided Gaze    Gaze Exercises X1 Viewing Horizontal;X1 Viewing Vertical       X1 Viewing Horizontal   Foot Position standing    Comments 30 seconds      X1 Viewing Vertical   Foot Position standing  Comments 30 seconds                   PT Education - 07/19/21 1104     Education Details HEP    Person(s) Educated Patient    Methods Explanation;Demonstration;Handout    Comprehension Verbalized understanding;Returned demonstration                 PT Long Term Goals - 07/19/21 1105       PT LONG TERM GOAL #1   Title The patient will be indep with HEP for gaze adaptation and neck tension stretches.    Baseline Goal met at eval today-- patient to call if further intervention needed    Time 4    Period Weeks    Status Achieved    Target Date 08/18/21                    Plan - 07/19/21 1106     Clinical Impression Statement The patient is a 40 year old female with h/o frequent migraines presenting to OP physical therapy due to acute onset of vertigo yesterday that is improved today.  She presents with visual symptoms of black dots worse in R eye that increase with ocular motor screening and improve with suboccipital release.  She has some evidence of dec'd VOR per positive L head impulse test.  She was negative today for nystagmus during positional testing and had mild sensation of dizziness with horizontal roll test.  She has suboccipital and c-spine tightness with palpation.  PT addressed deficits through HEP.  Patient to return to PT if any change in symptoms.    Personal Factors and Comorbidities Comorbidity 1    Comorbidities Migraine    Examination-Activity Limitations Bed Mobility    Examination-Participation Restrictions Community Activity    Stability/Clinical Decision Making Stable/Uncomplicated    Clinical Decision Making Low    Rehab Potential Good    PT Frequency 1x / week    PT Duration 4 weeks    PT Treatment/Interventions ADLs/Self Care Home Management;Patient/family education;Canalith  Repostioning;Vestibular;Neuromuscular re-education;Therapeutic activities;Therapeutic exercise;Functional mobility training;Manual techniques;Dry needling;Taping    PT Next Visit Plan No visits scheduled; PT to reassess if patient needs to return-- prescribed HEP today    Consulted and Agree with Plan of Care Patient             Patient will benefit from skilled therapeutic intervention in order to improve the following deficits and impairments:  Dizziness  Visit Diagnosis: Dizziness and giddiness - Plan: PT plan of care cert/re-cert     Problem List Patient Active Problem List   Diagnosis Date Noted   SVT (supraventricular tachycardia) ([place]) 07/25/2019   Abnormal breast biopsy 06/13/2016   Anxiety 06/13/2016   Patella-femoral syndrome 06/11/2015   Migraines    Rudell Cobb, PT, MPT  Jaiveon Suppes 07/19/2021, 11:20 AM  [facility] [place], [place], [address] Phone: [phone]   Fax:  [phone]  Name: Chanley Mcenery MRN: [id number] Date of Birth: 1980/12/06

## 2021-07-23 NOTE — Telephone Encounter (Signed)
Call patient I relayed Megan's message that she would be glad to see her for issues with an appointment  on 12 September at 8:30 in the morning as office visit.  She has continued with some with the feelings of dizziness lightheadedness (its better)  regards to BPPV  but she is concerned in regards to the word finding issues as its not something she has had previously and was wondering if it was related to the dizziness.  I did relay that if she had strokelike symptoms then to proceed to the ED and she did verbalize understanding.

## 2021-07-23 NOTE — Telephone Encounter (Signed)
I reviewed physical therapy's note.  They have recommended that she continue with the exercises they gave her to help with her vertigo.  I am happy to see her in the office if she is having issues with word finding.  Please advise patient that if she has worsening symptoms or strokelike symptoms she should go to the ED.

## 2021-07-25 ENCOUNTER — Ambulatory Visit: Payer: 59 | Admitting: Adult Health

## 2021-08-05 ENCOUNTER — Encounter: Payer: Self-pay | Admitting: Adult Health

## 2021-08-05 ENCOUNTER — Other Ambulatory Visit: Payer: Self-pay

## 2021-08-05 ENCOUNTER — Ambulatory Visit (INDEPENDENT_AMBULATORY_CARE_PROVIDER_SITE_OTHER): Payer: 59 | Admitting: Adult Health

## 2021-08-05 VITALS — BP 121/80 | HR 82 | Ht 64.0 in | Wt 149.0 lb

## 2021-08-05 DIAGNOSIS — R4789 Other speech disturbances: Secondary | ICD-10-CM | POA: Diagnosis not present

## 2021-08-05 DIAGNOSIS — R42 Dizziness and giddiness: Secondary | ICD-10-CM

## 2021-08-05 DIAGNOSIS — G43709 Chronic migraine without aura, not intractable, without status migrainosus: Secondary | ICD-10-CM

## 2021-08-05 NOTE — Progress Notes (Signed)
PATIENT: Summer Anderson DOB: [birth year]  REASON FOR VISIT: follow up HISTORY FROM: patient PRIMARY NEUROLOGIST: Dr. Jaynee Eagles  HISTORY OF PRESENT ILLNESS: Today 08/05/21:  Summer Anderson is a 40 year old female with a history of migraine headaches.  She returns today for evaluation of new symptoms.  She states that she will had a significant episode of what she thought may be vertigo.  She states that she felt extremely lightheaded particularly with movement of the eyes.  She states that she had numbness and tingling down the right arm that would come and go.  She states the night before she had a headache but it was slightly different than her previous migraines.  She states that it was at the base of the skull and down her neck.  But felt like a migraine.  Since then she has not had any additional episodes to that severity.  She states that if she moves her head in a certain direction or bends over and stands up she may feel slightly lightheaded.  She describes it as a drunk feeling without drinking alcohol.  Episodes are fairly brief.  After the first episode she did have an evaluation at physical therapy and they did not note any signs of vertigo.  She states that her migraines have been under well control with Botox.  She states that if she does get a headache she typically can take Excedrin Migraine and it resolves fairly quickly.  She also notes that since these episodes have started she has noticed some word finding difficulty.  States that she knows what she wants to say but sometimes cannot get the word out.  For example if she tells her kids to go get her keys she may not be able to get the word keys out but she knows that is what she is wanting to say.  She also states that she had 1 day of sharp shooting pain in the right temporal region.  He has not had the symptoms since.  She returns today for an evaluation.  06/04/21: Summer Anderson is a 40 year old female with a history of migraine  headaches.  She returns today for follow-up.  She is currently on Emgality.  Reports that it works well for 2 weeks and then the last 2 weeks a month she had several headaches.  Reports that her headaches can last up to 6 hours.  She does take Nurtec and it does help some but does not always resolve the headache.  She has tried other abortive therapies in the past but has found that Nurtec works the best for her.  She has tried Ajovy and Aimovig without benefit.  The patient is interested in trying another preventative therapy.  HISTORY June 04, 2020: Patient reports she is doing much better, she is on Ajovy and is helping reduce the frequency, weeks 1-3 the past then she can feel its time for the shot the last week and she will get 2-3 migraines, some do not last long, others may last a day, Nurtec is the best as needed medicine she has used for migraines.We will give her some samples so she can take them every 3 weeks. We discussed Nurtec as a preventative. We will try and get this for her as 16 a month. We also discussed she can pre-treat with nurtec such as prior to her period, or if she thinks she may have a migraine or is having a migraine prodrome.   HPI:  Summer Anderson is a  40 y.o. female here as requested by Binnie Rail, MD for migraines. PMHx anxiety, chronic headaches, depression, kidney stones, sinus tachycardia. Migraines started in HS. I reviewed records and meds, appears she was started on Aimovig recently and also tried several other medications in the past. She has had migraines since HS. They are usually on the left but sometimes on the right periorbital ut more upper forehead, she vomits, she has nausea, she cannot figure out triggers, she has mirena, she did a food and sleep diary and never found triggers, sister has migraines too. Also her 69 year old has them now. They are pounding, light and sound sensitivity, she has tried multiple triptans and maxalt works but she feels like she is in a  fog. Side effects to the medication. Excedrin migraine works. 23 headache days a month, she is not going to have children and has mirena. No aura. No medication overuse. She has 10-12 moderate to severe migraine days a month that can last 24-72 hours. She has new symptoms, the migraines are waking her up and worse within the last year. The severity and frequency are stable but the duration is worsening and longer. She feels the aimovig is wearing. No other focal neurologic deficits, associated symptoms, inciting events or modifiable factors.   Reviewed notes, labs and imaging from outside physicians, which showed:   Meds tried: topamax, elavil, excedrin prn, diclofenac,paxil, maxalt, aimovig, relpax, lexapro, magnesium, propranolol, zomig, zonegran   06/2019: TSH 1.62 normal. Cbc unremarkable, cmp normal   MRI brain 2014: showed No acute intracranial abnormalities including mass lesion or mass effect, hydrocephalus, extra-axial fluid collection, midline shift, hemorrhage, or acute infarction, large ischemic events (personally reviewed images)    REVIEW OF SYSTEMS: Out of a complete 14 system review of symptoms, the patient complains only of the following symptoms, and all other reviewed systems are negative.  ALLERGIES: Allergies  Allergen Reactions   Oxycodone-Acetaminophen Nausea Only    HOME MEDICATIONS: Outpatient Medications Prior to Visit  Medication Sig Dispense Refill   aspirin-acetaminophen-caffeine (EXCEDRIN MIGRAINE) 250-250-65 MG tablet Take 1 tablet by mouth every 6 (six) hours as needed for headache. 30 tablet 0   Biotin 10000 MCG TABS Take by mouth daily.     Botulinum Toxin Type A (BOTOX) 200 units SOLR Provider to inject 155 units into the muscles of the head and neck every 3 months. Discard remainder. 1 each 3   Cholecalciferol (VITAMIN D-3) 5000 units TABS Take 5,000 Units by mouth daily.     Diclofenac Sodium (PENNSAID) 2 % SOLN Place 2 g onto the skin 2 (two) times  daily. 112 g 3   levonorgestrel (MIRENA) 20 MCG/24HR IUD 1 Intra Uterine Device (1 each total) by Intrauterine route once. 1 each 0   ondansetron (ZOFRAN-ODT) 4 MG disintegrating tablet Take 1-2 tablets (4-8 mg total) by mouth every 8 (eight) hours as needed for nausea. 30 tablet 3   PARoxetine (PAXIL) 20 MG tablet Take 1 tablet (20 mg total) by mouth daily. 90 tablet 3   Rimegepant Sulfate (NURTEC) 75 MG TBDP Take 1 tablet by mouth as needed for migraine. Take as close to onset of migraine as possible. Max 1 tablet per 24 hours. 8 tablet 11   Galcanezumab-gnlm (EMGALITY) 120 MG/ML SOAJ Inject 120 mg into the skin every 21 ( twenty-one) days. 1 mL 11   Ubrogepant (UBRELVY) 100 MG TABS Take 100 mg by mouth every 2 (two) hours as needed. Maximum 277m a day. 16 tablet 11  No facility-administered medications prior to visit.    PAST MEDICAL HISTORY: Past Medical History:  Diagnosis Date   Abnormal breast biopsy 06/13/2016   Right, PSEUDOANGIOMATOUS STROMAL HYPERPLASIA ([place]). 6 month f/u    Acute bronchitis 09/18/2017   Allergy    Anxiety 06/13/2016   Chronic headaches    Depression    hx of, no medications since 12/15   Kidney stones 2004   Migraines    Started in HS Aura, nausea MRI normal Did not tolerate topamax in the past Excedrin migraine as needed No known triggers, can have a few a week or none for a couple of weeks    Patella-femoral syndrome 06/11/2015   Rash 01/28/2018   Right knee pain 07/25/2019   Sore throat 01/28/2018   Tachycardia 07/25/2019   sinus tachycardia   Weight gain 09/22/2014    PAST SURGICAL HISTORY: Past Surgical History:  Procedure Laterality Date   BREAST BIOPSY Right    PSEUDOANGIOMATOUS STROMAL HYPERPLASIA ([place]).   BREAST ENHANCEMENT SURGERY  12/2013   EP study  2006   performed in [place],  no inducible arrhythmias per patient    FAMILY HISTORY: Family History  Problem Relation Age of Onset   Acute myelogenous leukemia Father 87   Healthy  Mother    Migraines Sister    Parkinson's disease Maternal Grandfather        uncetain dx   Healthy Brother    Healthy Sister    Healthy Daughter    Migraines Daughter     SOCIAL HISTORY: Social History   Socioeconomic History   Marital status: Married    Spouse name: Not on file   Number of children: 2   Years of education: Not on file   Highest education level: Bachelor's degree (e.g., BA, AB, BS)  Occupational History   Not on file  Tobacco Use   Smoking status: Never   Smokeless tobacco: Never  Vaping Use   Vaping Use: Never used  Substance and Sexual Activity   Alcohol use: Yes    Alcohol/week: 0.0 standard drinks    Comment: Occasionally    Drug use: No   Sexual activity: Not on file  Other Topics Concern   Not on file  Social History Narrative   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   She lives with husband and two daughters.    Exercise:  running   Right handed   Caffeine: 1 cup coffee/day   Social Determinants of Health   Financial Resource Strain: Not on file  Food Insecurity: Not on file  Transportation Needs: Not on file  Physical Activity: Not on file  Stress: Not on file  Social Connections: Not on file  Intimate Partner Violence: Not on file      PHYSICAL EXAM  Vitals:   08/05/21 0835  BP: 121/80  Pulse: 82  Weight: 149 lb (67.6 kg)  Height: 5' 4"  (1.626 m)   Body mass index is 25.58 kg/m.  MMSE - Mini Mental State Exam 08/05/2021  Orientation to time 5  Orientation to Place 4  Registration 3  Attention/ Calculation 3  Recall 2  Language- name 2 objects 2  Language- repeat 1  Language- follow 3 step command 3  Language- read & follow direction 1  Write a sentence 1  Copy design 1  Total score 26     Generalized: Well developed, in no acute distress   Neurological examination  Mentation: Alert oriented to time,  place, history taking. Follows all commands speech and language fluent Cranial  nerve II-XII: Pupils were equal round reactive to light. Extraocular movements were full, visual field were full on confrontational test.  No nystagmus noted facial sensation and strength were normal. Uvula tongue midline. Head turning and shoulder shrug  were normal and symmetric. Motor: The motor testing reveals 5 over 5 strength of all 4 extremities. Good symmetric motor tone is noted throughout.  Sensory: Sensory testing is intact to soft touch on all 4 extremities. No evidence of extinction is noted.  Coordination: Cerebellar testing reveals good finger-nose-finger and heel-to-shin bilaterally.  Gait and station: Gait is normal.  Reflexes: Deep tendon reflexes are symmetric and normal bilaterally.   DIAGNOSTIC DATA (LABS, IMAGING, TESTING) - I reviewed patient records, labs, notes, testing and imaging myself where available.  Lab Results  Component Value Date   WBC 5.2 08/31/2020   HGB 13.1 08/31/2020   HCT 38.5 08/31/2020   MCV 97.5 08/31/2020   PLT 225.0 08/31/2020      Component Value Date/Time   NA 138 08/31/2020 0843   K 4.3 08/31/2020 0843   CL 104 08/31/2020 0843   CO2 28 08/31/2020 0843   GLUCOSE 85 08/31/2020 0843   BUN 16 08/31/2020 0843   CREATININE 0.80 08/31/2020 0843   CALCIUM 9.6 08/31/2020 0843   PROT 7.4 08/31/2020 0843   ALBUMIN 4.5 08/31/2020 0843   AST 14 08/31/2020 0843   ALT 13 08/31/2020 0843   ALKPHOS 41 08/31/2020 0843   BILITOT 0.6 08/31/2020 0843   GFRNONAA >60 11/16/2007 0600   GFRAA  11/16/2007 0600    >60        The eGFR has been calculated using the MDRD equation. This calculation has not been validated in all clinical   Lab Results  Component Value Date   CHOL 228 (H) 08/31/2020   HDL 53.70 08/31/2020   LDLCALC 157 (H) 08/31/2020   TRIG 89.0 08/31/2020   CHOLHDL 4 08/31/2020   No results found for: HGBA1C Lab Results  Component Value Date   VITAMINB12 386 12/09/2013   Lab Results  Component Value Date   TSH 0.97 08/31/2020       ASSESSMENT AND PLAN 40 y.o. year old female  has a past medical history of Abnormal breast biopsy (06/13/2016), Acute bronchitis (09/18/2017), Allergy, Anxiety (06/13/2016), Chronic headaches, Depression, Kidney stones (2004), Migraines, Patella-femoral syndrome (06/11/2015), Rash (01/28/2018), Right knee pain (07/25/2019), Sore throat (01/28/2018), Tachycardia (07/25/2019), and Weight gain (09/22/2014). here with:  1.  Migraine headaches  -- Continue Botox  2. Word Finding 3. Lightheadness, vertrigo?  -- We will complete MRI of the brain with and without contrast -- Blood work today -- MMSE 26/30 will monitor--if MRI of the brain is unremarkable may consider neuropsychological testing  Patient will follow-up in 4 months or sooner   Ward Givens, MSN, NP-C 08/05/2021, 8:40 AM [facility] [address], [place], [place] [address] [phone]

## 2021-08-05 NOTE — Patient Instructions (Addendum)
Your Plan:  MRI brain  Blood work today  If your symptoms worsen or you develop new symptoms please let us know.    Thank you for coming to see [place] at [facility]. I hope we have been able to provide you high quality care today.  You may receive a patient satisfaction survey over the next few weeks. We would appreciate your feedback and comments so that we may continue to improve ourselves and the health of our patients.

## 2021-08-06 ENCOUNTER — Other Ambulatory Visit: Payer: Self-pay | Admitting: Obstetrics and Gynecology

## 2021-08-06 DIAGNOSIS — Z1231 Encounter for screening mammogram for malignant neoplasm of breast: Secondary | ICD-10-CM

## 2021-08-06 LAB — CBC WITH DIFFERENTIAL/PLATELET
Basophils Absolute: 0 10*3/uL (ref 0.0–0.2)
Basos: 1 %
EOS (ABSOLUTE): 0 10*3/uL (ref 0.0–0.4)
Eos: 0 %
Hematocrit: 38.4 % (ref 34.0–46.6)
Hemoglobin: 12.6 g/dL (ref 11.1–15.9)
Immature Grans (Abs): 0 10*3/uL (ref 0.0–0.1)
Immature Granulocytes: 0 %
Lymphocytes Absolute: 1.5 10*3/uL (ref 0.7–3.1)
Lymphs: 30 %
MCH: 32.4 pg (ref 26.6–33.0)
MCHC: 32.8 g/dL (ref 31.5–35.7)
MCV: 99 fL — ABNORMAL HIGH (ref 79–97)
Monocytes Absolute: 0.5 10*3/uL (ref 0.1–0.9)
Monocytes: 9 %
Neutrophils Absolute: 3 10*3/uL (ref 1.4–7.0)
Neutrophils: 60 %
Platelets: 205 10*3/uL (ref 150–450)
RBC: 3.89 x10E6/uL (ref 3.77–5.28)
RDW: 11.3 % — ABNORMAL LOW (ref 11.7–15.4)
WBC: 5.1 10*3/uL (ref 3.4–10.8)

## 2021-08-06 LAB — COMPREHENSIVE METABOLIC PANEL
ALT: 8 IU/L (ref 0–32)
AST: 12 IU/L (ref 0–40)
Albumin/Globulin Ratio: 2 (ref 1.2–2.2)
Albumin: 4.4 g/dL (ref 3.8–4.8)
Alkaline Phosphatase: 49 IU/L (ref 44–121)
BUN/Creatinine Ratio: 11 (ref 9–23)
BUN: 9 mg/dL (ref 6–24)
Bilirubin Total: 0.4 mg/dL (ref 0.0–1.2)
CO2: 22 mmol/L (ref 20–29)
Calcium: 9.4 mg/dL (ref 8.7–10.2)
Chloride: 104 mmol/L (ref 96–106)
Creatinine, Ser: 0.8 mg/dL (ref 0.57–1.00)
Globulin, Total: 2.2 g/dL (ref 1.5–4.5)
Glucose: 90 mg/dL (ref 65–99)
Potassium: 4.4 mmol/L (ref 3.5–5.2)
Sodium: 140 mmol/L (ref 134–144)
Total Protein: 6.6 g/dL (ref 6.0–8.5)
eGFR: 95 mL/min/{1.73_m2} (ref >59)

## 2021-08-06 LAB — TSH: TSH: 1.18 u[IU]/mL (ref 0.450–4.500)

## 2021-08-06 LAB — VITAMIN B12: Vitamin B-12: 226 pg/mL — ABNORMAL LOW (ref 232–1245)

## 2021-08-08 ENCOUNTER — Encounter: Payer: Self-pay | Admitting: Adult Health

## 2021-08-14 ENCOUNTER — Ambulatory Visit: Payer: 59

## 2021-08-14 DIAGNOSIS — G43709 Chronic migraine without aura, not intractable, without status migrainosus: Secondary | ICD-10-CM | POA: Diagnosis not present

## 2021-08-14 DIAGNOSIS — R4789 Other speech disturbances: Secondary | ICD-10-CM | POA: Diagnosis not present

## 2021-08-14 DIAGNOSIS — R42 Dizziness and giddiness: Secondary | ICD-10-CM

## 2021-08-14 MED ORDER — GADOBENATE DIMEGLUMINE 529 MG/ML IV SOLN
15.0000 mL | Freq: Once | INTRAVENOUS | Status: AC | PRN
  Administered 2021-08-14: 15 mL via INTRAVENOUS

## 2021-08-19 ENCOUNTER — Encounter: Payer: Self-pay | Admitting: Adult Health

## 2021-08-19 DIAGNOSIS — R4789 Other speech disturbances: Secondary | ICD-10-CM

## 2021-08-26 NOTE — Addendum Note (Signed)
Addended by: Enedina Finner on: 08/26/2021 09:31 AM   Modules accepted: Orders

## 2021-09-12 ENCOUNTER — Other Ambulatory Visit: Payer: Self-pay

## 2021-09-12 ENCOUNTER — Ambulatory Visit
Admission: RE | Admit: 2021-09-12 | Discharge: 2021-09-12 | Disposition: A | Discharge status: Home / Self Care | Payer: 59 | Source: Ambulatory Visit | Attending: Obstetrics and Gynecology | Admitting: Obstetrics and Gynecology

## 2021-09-12 DIAGNOSIS — Z1231 Encounter for screening mammogram for malignant neoplasm of breast: Secondary | ICD-10-CM

## 2021-09-30 ENCOUNTER — Encounter: Payer: Self-pay | Admitting: Internal Medicine

## 2021-09-30 NOTE — Progress Notes (Signed)
Subjective:    Patient ID: Summer Anderson, female    DOB: [birth year], 40 y.o.   MRN: [id number]   This visit occurred during the SARS-CoV-2 public health emergency.  Safety protocols were in place, including screening questions prior to the visit, additional usage of staff PPE, and extensive cleaning of exam room while observing appropriate contact time as indicated for disinfecting solutions.    HPI She is here for a physical exam.   She had an episode of vertigo not long ago with tingling in her right arm. She had associated  nausea.     She did talk to her neurologist about her difficulty speaking at times.  She states that it did get better.  It is something she also had back in 2015 and was evaluated by neurology at that time as well.  She does not feel as much of an issue at this time.  Anxiety level is high and she does not feel like her anxiety is well controlled right now.   Medications and allergies reviewed with patient and updated if appropriate.  Patient Active Problem List   Diagnosis Date Noted   SVT (supraventricular tachycardia) (HCC) 07/25/2019   Abnormal breast biopsy 06/13/2016   Anxiety 06/13/2016   Patella-femoral syndrome 06/11/2015   Migraines     Current Outpatient Medications on File Prior to Visit  Medication Sig Dispense Refill   aspirin-acetaminophen-caffeine (EXCEDRIN MIGRAINE) 250-250-65 MG tablet Take 1 tablet by mouth every 6 (six) hours as needed for headache. 30 tablet 0   Biotin 68127 MCG TABS Take by mouth daily.     Botulinum Toxin Type A (BOTOX) 200 units SOLR Provider to inject 155 units into the muscles of the head and neck every 3 months. Discard remainder. 1 each 3   Cholecalciferol (VITAMIN D-3) 5000 units TABS Take 5,000 Units by mouth daily.     Diclofenac Sodium (PENNSAID) 2 % SOLN Place 2 g onto the skin 2 (two) times daily. 112 g 3   levonorgestrel (MIRENA) 20 MCG/24HR IUD 1 Intra Uterine Device (1 each total) by  Intrauterine route once. 1 each 0   ondansetron (ZOFRAN-ODT) 4 MG disintegrating tablet Take 1-2 tablets (4-8 mg total) by mouth every 8 (eight) hours as needed for nausea. 30 tablet 3   Rimegepant Sulfate (NURTEC) 75 MG TBDP Take 1 tablet by mouth as needed for migraine. Take as close to onset of migraine as possible. Max 1 tablet per 24 hours. 8 tablet 11   No current facility-administered medications on file prior to visit.    Past Medical History:  Diagnosis Date   Abnormal breast biopsy 06/13/2016   Right, PSEUDOANGIOMATOUS STROMAL HYPERPLASIA (PASH). 6 month f/u    Acute bronchitis 09/18/2017   Allergy    Anxiety 06/13/2016   Chronic headaches    Depression    hx of, no medications since 12/15   Kidney stones 2004   Migraines    Started in HS Aura, nausea MRI normal Did not tolerate topamax in the past Excedrin migraine as needed No known triggers, can have a few a week or none for a couple of weeks    Patella-femoral syndrome 06/11/2015   Rash 01/28/2018   Right knee pain 07/25/2019   Sore throat 01/28/2018   Tachycardia 07/25/2019   sinus tachycardia   Weight gain 09/22/2014    Past Surgical History:  Procedure Laterality Date   BREAST BIOPSY Right    PSEUDOANGIOMATOUS STROMAL HYPERPLASIA (PASH).   BREAST ENHANCEMENT  SURGERY  12/2013   EP study  2006   performed in [place],  no inducible arrhythmias per patient    Social History   Socioeconomic History   Marital status: Married    Spouse name: Not on file   Number of children: 2   Years of education: Not on file   Highest education level: Bachelor's degree (e.g., BA, AB, BS)  Occupational History   Not on file  Tobacco Use   Smoking status: Never   Smokeless tobacco: Never  Vaping Use   Vaping Use: Never used  Substance and Sexual Activity   Alcohol use: Yes    Alcohol/week: 0.0 standard drinks    Comment: Occasionally    Drug use: No   Sexual activity: Not on file  Other Topics Concern   Not on file   Social History Narrative   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   She lives with husband and two daughters.    Exercise:  running   Right handed   Caffeine: 1 cup coffee/day   Social Determinants of Health   Financial Resource Strain: Not on file  Food Insecurity: Not on file  Transportation Needs: Not on file  Physical Activity: Not on file  Stress: Not on file  Social Connections: Not on file    Family History  Problem Relation Age of Onset   Acute myelogenous leukemia Father 31   Healthy Mother    Migraines Sister    Parkinson's disease Maternal Grandfather        uncetain dx   Healthy Brother    Healthy Sister    Healthy Daughter    Migraines Daughter     Review of Systems  Constitutional:  Negative for chills and fever.  Eyes:  Negative for visual disturbance.  Respiratory:  Negative for cough, shortness of breath and wheezing.   Cardiovascular:  Positive for palpitations (occ - SVT - with anxiety). Negative for chest pain and leg swelling.  Gastrointestinal:  Negative for abdominal pain, blood in stool, constipation, diarrhea and nausea.       No gerd  Genitourinary:  Negative for dysuria.  Musculoskeletal:  Negative for arthralgias and back pain.  Skin:  Negative for color change and rash.  Neurological:  Positive for dizziness (one episode) and headaches.  Psychiatric/Behavioral:  Negative for dysphoric mood and sleep disturbance. The patient is nervous/anxious.       Objective:   Vitals:   10/01/21 0943  BP: 110/74  Pulse: 75  Temp: 98.1 F (36.7 C)  SpO2: 99%   Filed Weights   10/01/21 0943  Weight: 147 lb (66.7 kg)   Body mass index is 25.23 kg/m.  BP Readings from Last 3 Encounters:  10/01/21 110/74  08/05/21 121/80  06/04/21 123/80    Wt Readings from Last 3 Encounters:  10/01/21 147 lb (66.7 kg)  08/05/21 149 lb (67.6 kg)  06/04/21 142 lb 6.4 oz (64.6 kg)    Depression screen [facility] 2/9 10/01/2021  08/31/2020  Decreased Interest 0 0  Down, Depressed, Hopeless 0 0  PHQ - 2 Score 0 0  Altered sleeping 0 -  Tired, decreased energy 0 -  Change in appetite 0 -  Feeling bad or failure about yourself  0 -  Trouble concentrating 0 -  Moving slowly or fidgety/restless 0 -  Suicidal thoughts 0 -  PHQ-9 Score 0 -    GAD 7 : Generalized Anxiety Score 10/01/2021  Nervous, Anxious, on  Edge 3  Control/stop worrying 0  Worry too much - different things 0  Trouble relaxing 3  Restless 0  Easily annoyed or irritable 2  Afraid - awful might happen 0  Total GAD 7 Score 8  Anxiety Difficulty Somewhat difficult       Physical Exam Constitutional: She appears well-developed and well-nourished. No distress.  HENT:  Head: Normocephalic and atraumatic.  Right Ear: External ear normal. Normal ear canal and TM Left Ear: External ear normal.  Normal ear canal and TM Mouth/Throat: Oropharynx is clear and moist.  Eyes: Conjunctivae and EOM are normal.  Neck: Neck supple. No tracheal deviation present. No thyromegaly present.  No carotid bruit  Cardiovascular: Normal rate, regular rhythm and normal heart sounds.   No murmur heard.  No edema. Pulmonary/Chest: Effort normal and breath sounds normal. No respiratory distress. She has no wheezes. She has no rales.  Breast: deferred   Abdominal: Soft. She exhibits no distension. There is no tenderness.  Lymphadenopathy: She has no cervical adenopathy.  Skin: Skin is warm and dry. She is not diaphoretic.  Psychiatric: She has a normal mood and affect. Her behavior is normal.     Lab Results  Component Value Date   WBC 5.1 08/05/2021   HGB 12.6 08/05/2021   HCT 38.4 08/05/2021   PLT 205 08/05/2021   GLUCOSE 90 08/05/2021   CHOL 228 (H) 08/31/2020   TRIG 89.0 08/31/2020   HDL 53.70 08/31/2020   LDLCALC 157 (H) 08/31/2020   ALT 8 08/05/2021   AST 12 08/05/2021   NA 140 08/05/2021   K 4.4 08/05/2021   CL 104 08/05/2021   CREATININE 0.80  08/05/2021   BUN 9 08/05/2021   CO2 22 08/05/2021   TSH 1.180 08/05/2021         Assessment & Plan:   Physical exam: Screening blood work  ordered Exercise  walking, running Weight  good  Substance abuse  none   Screened for depression using the PHQ 9 scale.  No evidence of depression.    Reviewed recommended immunizations.   Health Maintenance  Topic Date Due   COVID-19 Vaccine (1) 10/17/2021 (Originally 02/09/1982)   INFLUENZA VACCINE  02/21/2022 (Originally 06/24/2021)   PAP SMEAR-Modifier  10/10/2024   TETANUS/TDAP  06/10/2025   HIV Screening  Completed   Pneumococcal Vaccine 71-88 Years old  Aged Out   HPV VACCINES  Aged Out   Hepatitis C Screening  Discontinued          See Problem List for Assessment and Plan of chronic medical problems.

## 2021-09-30 NOTE — Patient Instructions (Addendum)
Medications changes include :  start buspar 5 mg three two - times daily    Your prescription(s) have been submitted to your pharmacy. Please take as directed and contact our office if you believe you are having problem(s) with the medication(s).    Please followup in 1 year   Health Maintenance, Female Adopting a healthy lifestyle and getting preventive care are important in promoting health and wellness. Ask your health care provider about: The right schedule for you to have regular tests and exams. Things you can do on your own to prevent diseases and keep yourself healthy. What should I know about diet, weight, and exercise? Eat a healthy diet  Eat a diet that includes plenty of vegetables, fruits, low-fat dairy products, and lean protein. Do not eat a lot of foods that are high in solid fats, added sugars, or sodium. Maintain a healthy weight Body mass index (BMI) is used to identify weight problems. It estimates body fat based on height and weight. Your health care provider can help determine your BMI and help you achieve or maintain a healthy weight. Get regular exercise Get regular exercise. This is one of the most important things you can do for your health. Most adults should: Exercise for at least 150 minutes each week. The exercise should increase your heart rate and make you sweat (moderate-intensity exercise). Do strengthening exercises at least twice a week. This is in addition to the moderate-intensity exercise. Spend less time sitting. Even light physical activity can be beneficial. Watch cholesterol and blood lipids Have your blood tested for lipids and cholesterol at 41 years of age, then have this test every 5 years. Have your cholesterol levels checked more often if: Your lipid or cholesterol levels are high. You are older than 41 years of age. You are at high risk for heart disease. What should I know about cancer screening? Depending on your health history  and family history, you may need to have cancer screening at various ages. This may include screening for: Breast cancer. Cervical cancer. Colorectal cancer. Skin cancer. Lung cancer. What should I know about heart disease, diabetes, and high blood pressure? Blood pressure and heart disease High blood pressure causes heart disease and increases the risk of stroke. This is more likely to develop in people who have high blood pressure readings or are overweight. Have your blood pressure checked: Every 3-5 years if you are 12-91 years of age. Every year if you are 35 years old or older. Diabetes Have regular diabetes screenings. This checks your fasting blood sugar level. Have the screening done: Once every three years after age 62 if you are at a normal weight and have a low risk for diabetes. More often and at a younger age if you are overweight or have a high risk for diabetes. What should I know about preventing infection? Hepatitis B If you have a higher risk for hepatitis B, you should be screened for this virus. Talk with your health care provider to find out if you are at risk for hepatitis B infection. Hepatitis C Testing is recommended for: Everyone born from [birth year] through 1966. Anyone with known risk factors for hepatitis C. Sexually transmitted infections (STIs) Get screened for STIs, including gonorrhea and chlamydia, if: You are sexually active and are younger than 41 years of age. You are older than 41 years of age and your health care provider tells you that you are at risk for this type of infection. Your sexual  activity has changed since you were last screened, and you are at increased risk for chlamydia or gonorrhea. Ask your health care provider if you are at risk. Ask your health care provider about whether you are at high risk for HIV. Your health care provider may recommend a prescription medicine to help prevent HIV infection. If you choose to take medicine to prevent  HIV, you should first get tested for HIV. You should then be tested every 3 months for as long as you are taking the medicine. Pregnancy If you are about to stop having your period (premenopausal) and you may become pregnant, seek counseling before you get pregnant. Take 400 to 800 micrograms (mcg) of folic acid every day if you become pregnant. Ask for birth control (contraception) if you want to prevent pregnancy. Osteoporosis and menopause Osteoporosis is a disease in which the bones lose minerals and strength with aging. This can result in bone fractures. If you are 25 years old or older, or if you are at risk for osteoporosis and fractures, ask your health care provider if you should: Be screened for bone loss. Take a calcium or vitamin D supplement to lower your risk of fractures. Be given hormone replacement therapy (HRT) to treat symptoms of menopause. Follow these instructions at home: Alcohol use Do not drink alcohol if: Your health care provider tells you not to drink. You are pregnant, may be pregnant, or are planning to become pregnant. If you drink alcohol: Limit how much you have to: 0-1 drink a day. Know how much alcohol is in your drink. In the [place] one drink equals one 12 oz bottle of beer (355 mL), one 5 oz glass of wine (148 mL), or one 1 oz glass of hard liquor (44 mL). Lifestyle Do not use any products that contain nicotine or tobacco. These products include cigarettes, chewing tobacco, and vaping devices, such as e-cigarettes. If you need help quitting, ask your health care provider. Do not use street drugs. Do not share needles. Ask your health care provider for help if you need support or information about quitting drugs. General instructions Schedule regular health, dental, and eye exams. Stay current with your vaccines. Tell your health care provider if: You often feel depressed. You have ever been abused or do not feel safe at home. Summary Adopting a  healthy lifestyle and getting preventive care are important in promoting health and wellness. Follow your health care provider's instructions about healthy diet, exercising, and getting tested or screened for diseases. Follow your health care provider's instructions on monitoring your cholesterol and blood pressure. This information is not intended to replace advice given to you by your health care provider. Make sure you discuss any questions you have with your health care provider. Document Revised: 04/01/2021 Document Reviewed: 04/01/2021 Elsevier Patient Education  2022 [employer].

## 2021-10-01 ENCOUNTER — Ambulatory Visit (INDEPENDENT_AMBULATORY_CARE_PROVIDER_SITE_OTHER): Payer: 59 | Admitting: Internal Medicine

## 2021-10-01 ENCOUNTER — Other Ambulatory Visit: Payer: Self-pay

## 2021-10-01 VITALS — BP 110/74 | HR 75 | Temp 98.1°F | Ht 64.0 in | Wt 147.0 lb

## 2021-10-01 DIAGNOSIS — F419 Anxiety disorder, unspecified: Secondary | ICD-10-CM | POA: Diagnosis not present

## 2021-10-01 DIAGNOSIS — Z1331 Encounter for screening for depression: Secondary | ICD-10-CM

## 2021-10-01 DIAGNOSIS — I471 Supraventricular tachycardia, unspecified: Secondary | ICD-10-CM

## 2021-10-01 DIAGNOSIS — Z0000 Encounter for general adult medical examination without abnormal findings: Secondary | ICD-10-CM

## 2021-10-01 DIAGNOSIS — G43109 Migraine with aura, not intractable, without status migrainosus: Secondary | ICD-10-CM | POA: Diagnosis not present

## 2021-10-01 MED ORDER — BUSPIRONE HCL 5 MG PO TABS
5.0000 mg | ORAL_TABLET | Freq: Three times a day (TID) | ORAL | 5 refills | Status: DC
Start: 2021-10-01 — End: 2021-10-24

## 2021-10-01 MED ORDER — PAROXETINE HCL 20 MG PO TABS: 20.0000 mg | ORAL_TABLET | Freq: Every day | ORAL | 3 refills | Status: DC

## 2021-10-01 NOTE — Assessment & Plan Note (Addendum)
Chronic Following with neurology Getting Botox injections Taking Nurtec as needed, which does not work great  Recently had an episode of vertigo with some right arm tingling-likely migraine variant

## 2021-10-01 NOTE — Assessment & Plan Note (Addendum)
Chronic Intermittent palpitations Often related to anxiety Overall controlled and not concerning

## 2021-10-01 NOTE — Assessment & Plan Note (Addendum)
Chronic Not controlled-Gad 7 score 8 today confirming uncontrolled anxiety Feels anxious all the time Continue paxil 20 mg daily We will start BuSpar 5 mg twice daily-3 times daily  She will update me on how this works.  If this is not effective we can increase Paxil, change Paxil or if tolerated increase BuSpar further

## 2021-10-10 ENCOUNTER — Ambulatory Visit (INDEPENDENT_AMBULATORY_CARE_PROVIDER_SITE_OTHER): Payer: 59 | Admitting: Adult Health

## 2021-10-10 ENCOUNTER — Other Ambulatory Visit: Payer: Self-pay

## 2021-10-10 DIAGNOSIS — G43709 Chronic migraine without aura, not intractable, without status migrainosus: Secondary | ICD-10-CM | POA: Diagnosis not present

## 2021-10-10 NOTE — Progress Notes (Signed)
Botox- 200 units x 1 vial Lot: [id number] Expiration: 02/2024 NDC: 2202-5427-06  Bacteriostatic 0.9% Sodium Chloride- 42mL total Lot: [id number] Expiration: 11/24/2022 NDC: 3151-7616-07  Dx: P71.062 S/P

## 2021-10-10 NOTE — Progress Notes (Signed)
       BOTOX PROCEDURE NOTE FOR MIGRAINE HEADACHE    Contraindications and precautions discussed with patient(above). Aseptic procedure was observed and patient tolerated procedure. Procedure performed by Butch Penny, NP  The condition has existed for more than 6 months, and pt does not have a diagnosis of ALS, Myasthenia Gravis or Lambert-Eaton Syndrome.  Risks and benefits of injections discussed and pt agrees to proceed with the procedure.  Written consent obtained  These injections are medically necessary. She Receives good benefit. These injections do not cause sedations or hallucinations which the oral therapies may cause.  Indication/Diagnosis: chronic migraine BOTOX(J0585) injection was performed according to protocol by [employer]. 200 units of BOTOX was dissolved into 4 cc NS.   NDC: [address]-31  Type of toxin: Botox  Botox- 200 units x 1 vial Lot: [id number] Expiration: 02/2024 NDC: 1950-9326-71   Bacteriostatic 0.9% Sodium Chloride- 60mL total Lot: [id number] Expiration: 11/24/2022 NDC: 9833-8250-53   Dx: Z76.734   Description of procedure:  The patient was placed in a sitting position. The standard protocol was used for Botox as follows, with 5 units of Botox injected at each site:   -Procerus muscle, midline injection  -Corrugator muscle, bilateral injection  -Frontalis muscle, bilateral injection, with 2 sites each side, medial injection was performed in the upper one third of the frontalis muscle, in the region vertical from the medial inferior edge of the superior orbital rim. The lateral injection was again in the upper one third of the forehead vertically above the lateral limbus of the cornea, 1.5 cm lateral to the medial injection site.  -Temporalis muscle injection, 4 sites, bilaterally. The first injection was 3 cm above the tragus of the ear, second injection site was 1.5 cm to 3 cm up from the first injection site in line with the tragus of the ear.  The third injection site was 1.5-3 cm forward between the first 2 injection sites. The fourth injection site was 1.5 cm posterior to the second injection site.  -Occipitalis muscle injection, 3 sites, bilaterally. The first injection was done one half way between the occipital protuberance and the tip of the mastoid process behind the ear. The second injection site was done lateral and superior to the first, 1 fingerbreadth from the first injection. The third injection site was 1 fingerbreadth superiorly and medially from the first injection site.  -Cervical paraspinal muscle injection, 2 sites, bilateral knee first injection site was 1 cm from the midline of the cervical spine, 3 cm inferior to the lower border of the occipital protuberance. The second injection site was 1.5 cm superiorly and laterally to the first injection site.  -Trapezius muscle injection was performed at 3 sites, bilaterally. The first injection site was in the upper trapezius muscle halfway between the inflection point of the neck, and the acromion. The second injection site was one half way between the acromion and the first injection site. The third injection was done between the first injection site and the inflection point of the neck.   Will return for repeat injection in 3 months.   A 200 unitsof Botox was used, 155 units were injected, the rest of the Botox was wasted. The patient tolerated the procedure well, there were no complications of the above procedure.  Butch Penny, MSN, NP-C 10/10/2021, 11:33 AM [facility] [address], [address] [place], [place] [address] [phone]

## 2021-10-24 ENCOUNTER — Other Ambulatory Visit: Payer: Self-pay | Admitting: Internal Medicine

## 2021-11-21 ENCOUNTER — Encounter: Payer: Self-pay | Admitting: Internal Medicine

## 2021-12-09 ENCOUNTER — Encounter: Payer: Self-pay | Admitting: Internal Medicine

## 2021-12-11 ENCOUNTER — Telehealth: Payer: Self-pay | Admitting: Adult Health

## 2021-12-11 NOTE — Telephone Encounter (Signed)
Patient has a Botox appointment 01/09/22. [employer] Botox PA will expire 12/18/21. I initiated new request via [employer] portal. Dx: G43.709. PA #[id number] (12/10/21-12/10/22).

## 2021-12-12 ENCOUNTER — Ambulatory Visit (INDEPENDENT_AMBULATORY_CARE_PROVIDER_SITE_OTHER): Payer: 59 | Admitting: Nurse Practitioner

## 2021-12-12 ENCOUNTER — Other Ambulatory Visit: Payer: Self-pay

## 2021-12-12 ENCOUNTER — Encounter: Payer: Self-pay | Admitting: Nurse Practitioner

## 2021-12-12 VITALS — BP 120/80 | HR 90 | Temp 99.4°F | Ht 64.0 in | Wt 139.7 lb

## 2021-12-12 DIAGNOSIS — J0100 Acute maxillary sinusitis, unspecified: Secondary | ICD-10-CM

## 2021-12-12 MED ORDER — FLUTICASONE PROPIONATE 50 MCG/ACT NA SUSP: 2.0000 | Freq: Every day | NASAL | 6 refills | Status: DC

## 2021-12-12 MED ORDER — AMOXICILLIN-POT CLAVULANATE 875-125 MG PO TABS
1.0000 | ORAL_TABLET | Freq: Two times a day (BID) | ORAL | 0 refills | Status: DC
Start: 2021-12-12 — End: 2022-01-09

## 2021-12-12 NOTE — Progress Notes (Signed)
Subjective:  Patient ID: Summer Anderson, female    DOB: 1980-12-21  Age: 41 y.o. MRN: [id number]  CC:  Chief Complaint  Patient presents with   Sinusitis      HPI  This patient arrives today for the above.  Symptoms have been present for 3 weeks.  She is experiencing nasal congestion, postnasal drip, right cheek pain and right sided headache.  She denies fever, shortness of breath, wheezing.  She has not checked a COVID test.  Patient has been using over-the-counter Mucinex and Sudafed with mild improvement in her symptoms.  Past Medical History:  Diagnosis Date   Abnormal breast biopsy 06/13/2016   Right, PSEUDOANGIOMATOUS STROMAL HYPERPLASIA (PASH). 6 month f/u    Acute bronchitis 09/18/2017   Allergy    Anxiety 06/13/2016   Chronic headaches    Depression    hx of, no medications since 12/15   Kidney stones 2004   Migraines    Started in HS Aura, nausea MRI normal Did not tolerate topamax in the past Excedrin migraine as needed No known triggers, can have a few a week or none for a couple of weeks    Patella-femoral syndrome 06/11/2015   Rash 01/28/2018   Right knee pain 07/25/2019   Sore throat 01/28/2018   Tachycardia 07/25/2019   sinus tachycardia   Weight gain 09/22/2014      Family History  Problem Relation Age of Onset   Acute myelogenous leukemia Father 4258   Healthy Mother    Migraines Sister    Parkinson's disease Maternal Grandfather        uncetain dx   Healthy Brother    Healthy Sister    Healthy Daughter    Migraines Daughter     Social History   Social History Narrative   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   She lives with husband and two daughters.    Exercise:  running   Right handed   Caffeine: 1 cup coffee/day   Social History   Tobacco Use   Smoking status: Never   Smokeless tobacco: Never  Substance Use Topics   Alcohol use: Yes    Alcohol/week: 0.0 standard drinks    Comment: Occasionally       Current Meds  Medication Sig   amoxicillin-clavulanate (AUGMENTIN) 875-125 MG tablet Take 1 tablet by mouth 2 (two) times daily.   aspirin-acetaminophen-caffeine (EXCEDRIN MIGRAINE) 250-250-65 MG tablet Take 1 tablet by mouth every 6 (six) hours as needed for headache.   Biotin 4098110000 MCG TABS Take by mouth daily.   Botulinum Toxin Type A (BOTOX) 200 units SOLR Provider to inject 155 units into the muscles of the head and neck every 3 months. Discard remainder.   busPIRone (BUSPAR) 5 MG tablet TAKE 1 TABLET BY MOUTH THREE TIMES A DAY   Cholecalciferol (VITAMIN D-3) 5000 units TABS Take 5,000 Units by mouth daily.   Diclofenac Sodium (PENNSAID) 2 % SOLN Place 2 g onto the skin 2 (two) times daily.   fluticasone (FLONASE) 50 MCG/ACT nasal spray Place 2 sprays into both nostrils daily.   levonorgestrel (MIRENA) 20 MCG/24HR IUD 1 Intra Uterine Device (1 each total) by Intrauterine route once.   ondansetron (ZOFRAN-ODT) 4 MG disintegrating tablet Take 1-2 tablets (4-8 mg total) by mouth every 8 (eight) hours as needed for nausea.   PARoxetine (PAXIL) 20 MG tablet Take 1 tablet (20 mg total) by mouth daily.   Rimegepant Sulfate (NURTEC)  75 MG TBDP Take 1 tablet by mouth as needed for migraine. Take as close to onset of migraine as possible. Max 1 tablet per 24 hours.    ROS:  Review of Systems  Constitutional:  Negative for fever.  HENT:  Positive for congestion (yellow drainage), sinus pain (right-side) and sore throat.   Respiratory:  Positive for cough. Negative for sputum production, shortness of breath and wheezing.   Cardiovascular:  Negative for chest pain.  Neurological:  Negative for headaches.    Objective:   Today's Vitals: BP 120/80 (BP Location: Left Arm, Patient Position: Sitting, Cuff Size: Normal)    Pulse 90    Temp 99.4 F (37.4 C) (Oral)    Ht 5\' 4"  (1.626 m)    Wt 139 lb 11.2 oz (63.4 kg)    SpO2 99%    BMI 23.98 kg/m  Vitals with BMI 12/12/2021 10/01/2021 08/05/2021   Height 5\' 4"  5\' 4"  5\' 4"   Weight 139 lbs 11 oz 147 lbs 149 lbs  BMI 23.97 25.22 25.56  Systolic 120 110 10/05/2021  Diastolic 80 74 80  Pulse 90 75 82     Physical Exam Vitals reviewed.  Constitutional:      General: She is not in acute distress.    Appearance: Normal appearance.  HENT:     Head: Normocephalic and atraumatic.      Comments: Tender to palpation    Right Ear: Hearing, tympanic membrane, ear canal and external ear normal.     Left Ear: Hearing, tympanic membrane, ear canal and external ear normal.  Neck:     Vascular: No carotid bruit.  Cardiovascular:     Rate and Rhythm: Normal rate and regular rhythm.     Pulses: Normal pulses.     Heart sounds: Normal heart sounds.  Pulmonary:     Effort: Pulmonary effort is normal.     Breath sounds: Normal breath sounds.  Lymphadenopathy:     Cervical: No cervical adenopathy.  Skin:    General: Skin is warm and dry.  Neurological:     General: No focal deficit present.     Mental Status: She is alert and oriented to person, place, and time.  Psychiatric:        Mood and Affect: Mood normal.        Behavior: Behavior normal.        Judgment: Judgment normal.         Assessment and Plan   1. Acute non-recurrent maxillary sinusitis      Plan: 1.  We will treat with course of Augmentin.  Patient will continue to use Mucinex and Sudafed as needed.  Also prescribed Flonase nasal spray that she can take as needed.  She was told to call the office if symptoms persist or worsen.   Tests ordered No orders of the defined types were placed in this encounter.     Meds ordered this encounter  Medications   amoxicillin-clavulanate (AUGMENTIN) 875-125 MG tablet    Sig: Take 1 tablet by mouth 2 (two) times daily.    Dispense:  20 tablet    Refill:  0    Order Specific Question:   Supervising Provider    Answer:   BURNS, STACY J [[id number]]   fluticasone (FLONASE) 50 MCG/ACT nasal spray    Sig: Place 2 sprays into  both nostrils daily.    Dispense:  16 g    Refill:  6    Order Specific Question:  Supervising Provider    Answer:   Pincus Sanes [[id number]]    Patient to follow-up in in October when due for comprehensive physical exam with primary care provider, or sooner as needed.  Summer Paddy, NP

## 2021-12-16 ENCOUNTER — Ambulatory Visit: Payer: 59 | Admitting: Internal Medicine

## 2021-12-25 DIAGNOSIS — Z9886 Personal history of breast implant removal: Secondary | ICD-10-CM

## 2021-12-25 HISTORY — DX: Personal history of breast implant removal: Z98.86

## 2021-12-27 DIAGNOSIS — Z9886 Personal history of breast implant removal: Secondary | ICD-10-CM | POA: Insufficient documentation

## 2021-12-27 HISTORY — PX: REMOVAL OF BILATERAL TISSUE EXPANDERS WITH PLACEMENT OF BILATERAL BREAST IMPLANTS: SHX6431

## 2022-01-02 NOTE — Telephone Encounter (Signed)
Received (1)  200 unit vial of Botox from [facility]. ?

## 2022-01-09 ENCOUNTER — Encounter: Payer: Self-pay | Admitting: Adult Health

## 2022-01-09 ENCOUNTER — Other Ambulatory Visit: Payer: Self-pay

## 2022-01-09 ENCOUNTER — Ambulatory Visit (INDEPENDENT_AMBULATORY_CARE_PROVIDER_SITE_OTHER): Payer: 59 | Admitting: Adult Health

## 2022-01-09 DIAGNOSIS — G43709 Chronic migraine without aura, not intractable, without status migrainosus: Secondary | ICD-10-CM

## 2022-01-09 NOTE — Progress Notes (Signed)
Botox- 200 units x 1 vial Lot: [id number] Expiration: 02/2024 NDC: 2330-0762-26  Bacteriostatic 0.9% Sodium Chloride- 44mL total Lot: [id number] Expiration: 12/25/2022 NDC: 6256-3893-73  Dx: S28.768 S/P

## 2022-01-09 NOTE — Progress Notes (Signed)
01/09/22: better improvement after last round of Botox. 1 headache a week and not as severe. 1-2 weeks before botox is due headache frequency increases. Uses Nutrex for abortive and that works well.    BOTOX PROCEDURE NOTE FOR MIGRAINE HEADACHE    Contraindications and precautions discussed with patient(above). Aseptic procedure was observed and patient tolerated procedure. Procedure performed by Ward Givens, NP  The condition has existed for more than 6 months, and pt does not have a diagnosis of ALS, Myasthenia Gravis or Lambert-Eaton Syndrome.  Risks and benefits of injections discussed and pt agrees to proceed with the procedure.  Written consent obtained  These injections are medically necessary. She Receives good benefit. These injections do not cause sedations or hallucinations which the oral therapies may cause.  Indication/Diagnosis: chronic migraine BOTOX(J0585) injection was performed according to protocol by [facility]. 200 units of BOTOX was dissolved into 4 cc NS.   NDC: [id number]  Type of toxin: Botox Botox- 200 units x 1 vial Lot: [id number] Expiration: 02/2024 NDC: [id number]   Bacteriostatic 0.9% Sodium Chloride- 53mL total Lot: [id number] Expiration: 12/25/2022 NDC: [id number]  Description of procedure:  The patient was placed in a sitting position. The standard protocol was used for Botox as follows, with 5 units of Botox injected at each site:   -Procerus muscle, midline injection  -Corrugator muscle, bilateral injection  -Frontalis muscle, bilateral injection, with 2 sites each side, medial injection was performed in the upper one third of the frontalis muscle, in the region vertical from the medial inferior edge of the superior orbital rim. The lateral injection was again in the upper one third of the forehead vertically above the lateral limbus of the cornea, 1.5 cm lateral to the medial injection site.  -Temporalis muscle injection, 4 sites,  bilaterally. The first injection was 3 cm above the tragus of the ear, second injection site was 1.5 cm to 3 cm up from the first injection site in line with the tragus of the ear. The third injection site was 1.5-3 cm forward between the first 2 injection sites. The fourth injection site was 1.5 cm posterior to the second injection site.  -Occipitalis muscle injection, 3 sites, bilaterally. The first injection was done one half way between the occipital protuberance and the tip of the mastoid process behind the ear. The second injection site was done lateral and superior to the first, 1 fingerbreadth from the first injection. The third injection site was 1 fingerbreadth superiorly and medially from the first injection site.  -Cervical paraspinal muscle injection, 2 sites, bilateral knee first injection site was 1 cm from the midline of the cervical spine, 3 cm inferior to the lower border of the occipital protuberance. The second injection site was 1.5 cm superiorly and laterally to the first injection site.  -Trapezius muscle injection was performed at 3 sites, bilaterally. The first injection site was in the upper trapezius muscle halfway between the inflection point of the neck, and the acromion. The second injection site was one half way between the acromion and the first injection site. The third injection was done between the first injection site and the inflection point of the neck.   Will return for repeat injection in 3 months.   A 200 unitsof Botox was used, 155 units were injected, the rest of the Botox was wasted. The patient tolerated the procedure well, there were no complications of the above procedure.  Ward Givens, MSN, NP-C 01/09/2022, 9:34 AM [facility] [phone]  [address] [place], [place] [address] [phone]

## 2022-01-23 IMAGING — MG DIGITAL SCREENING BREAST BILAT IMPLANT W/ TOMO W/ CAD
8 of 12 series · 8 of 28 positions shown · non-contrast
Comparison: Previous exam(s).

CLINICAL DATA: Screening.

EXAM:
DIGITAL SCREENING BILATERAL MAMMOGRAM WITH IMPLANTS, CAD AND
TOMOSYNTHESIS
TECHNIQUE: Bilateral screening digital craniocaudal and mediolateral oblique
mammograms were obtained. Bilateral screening digital breast
tomosynthesis was performed. The images were evaluated with
computer-aided detection. Standard and/or implant displaced views
were performed.

[R CC]
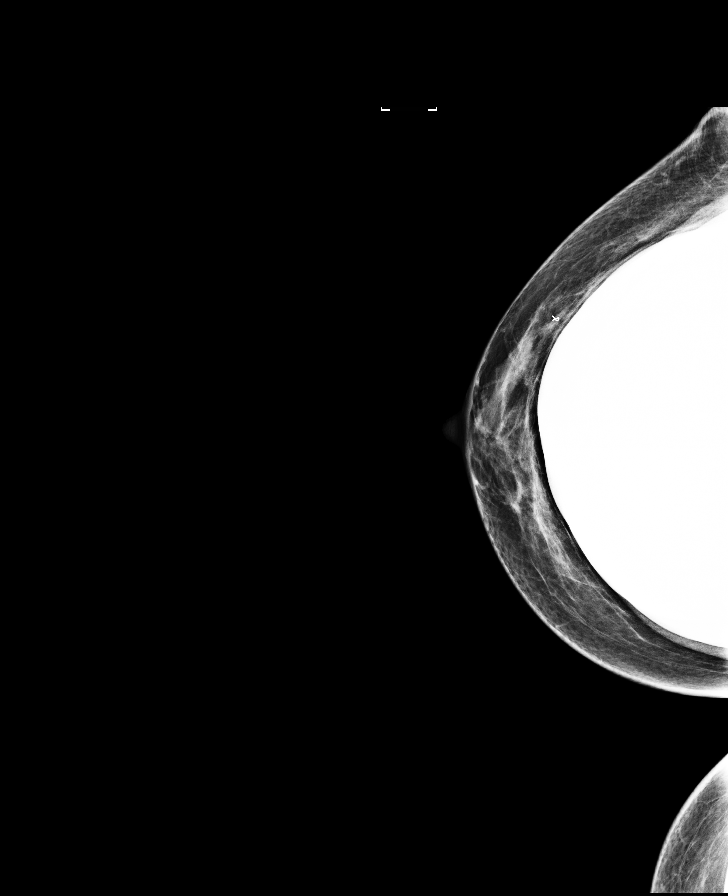

[L CC]
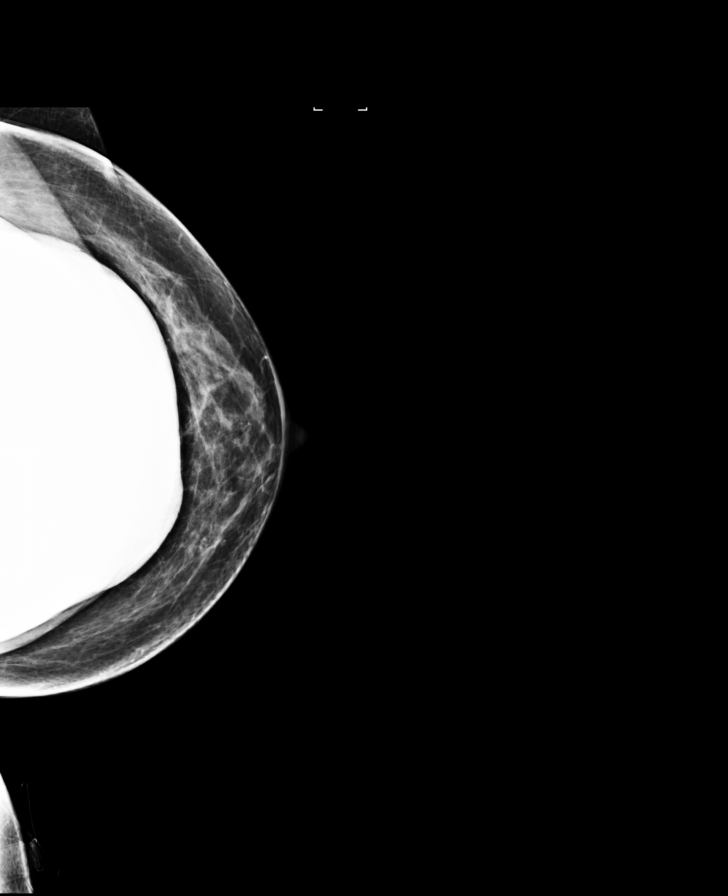

[R MLO]
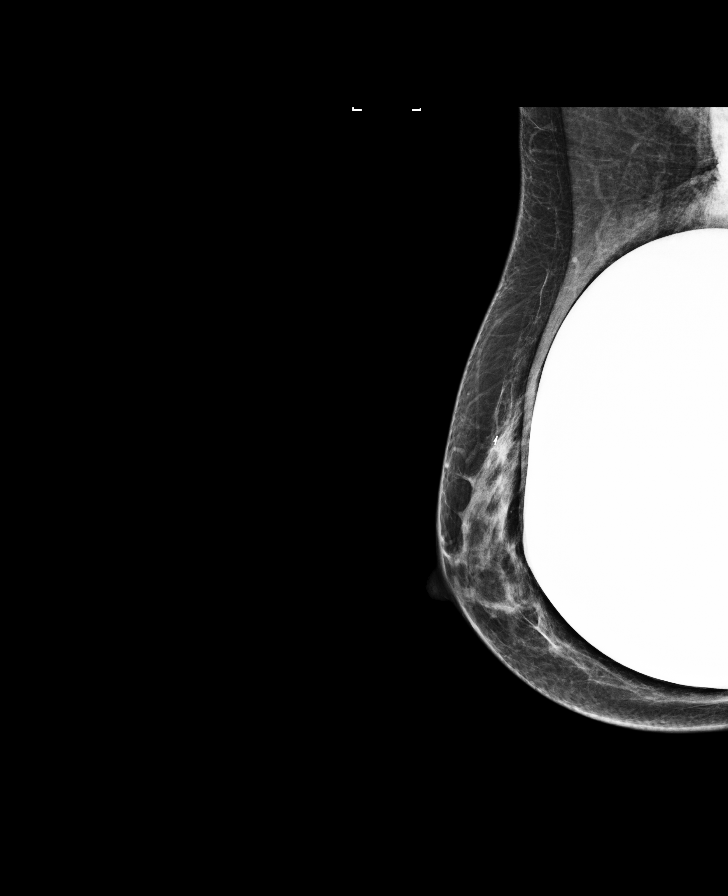

[L MLO]
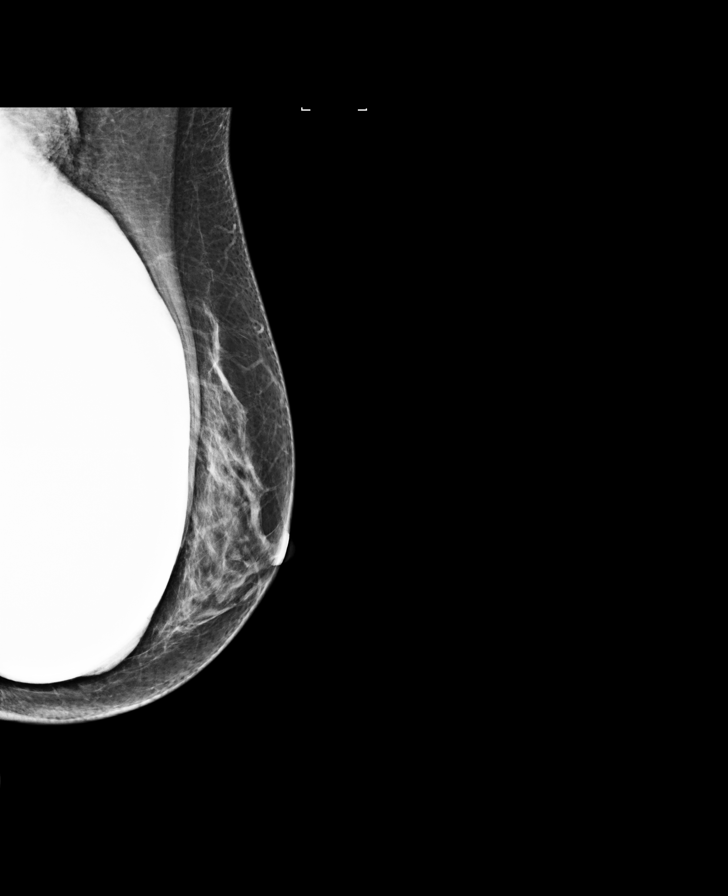

[R CC synth-2D]
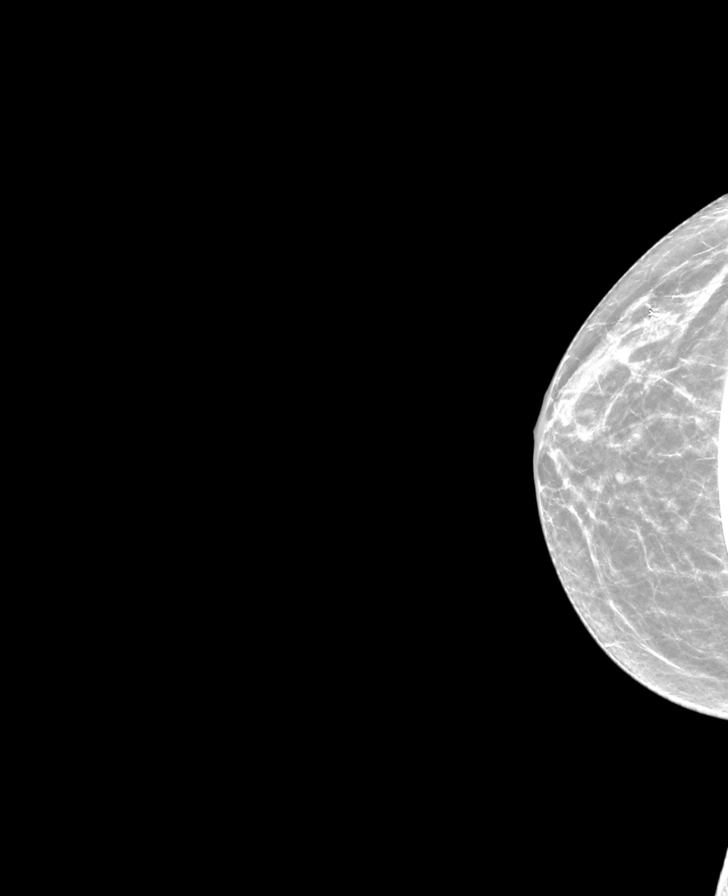

[L MLO synth-2D]
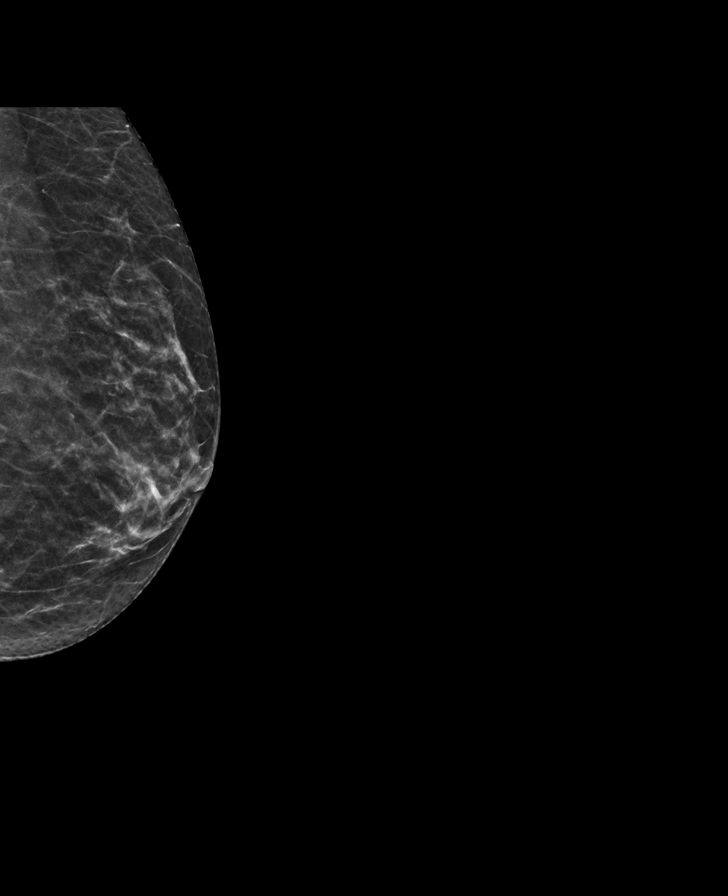

[R MLO synth-2D]
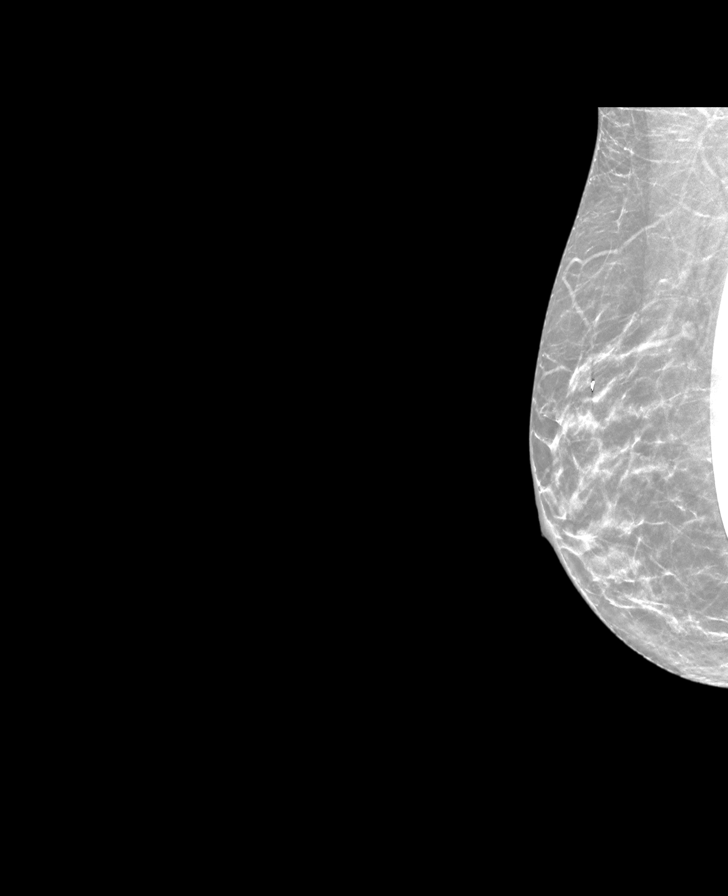

[L CC synth-2D]
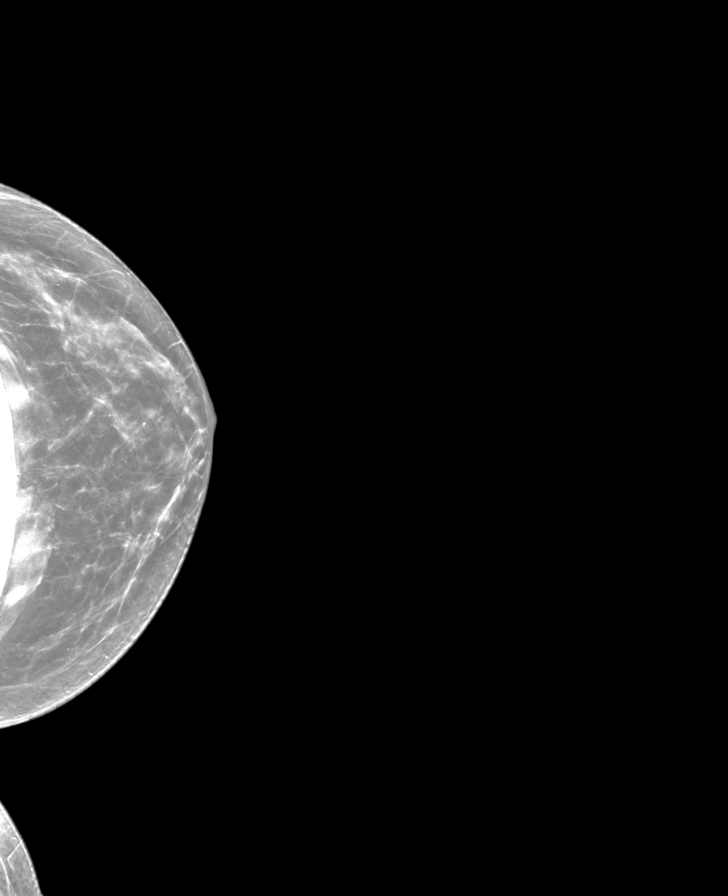

[8 of 28 positions shown; findings below may reference images not displayed]

ACR Breast Density Category b: There are scattered areas of
fibroglandular density.
FINDINGS: The patient has retropectoral silicone implants. There are no
findings suspicious for malignancy.

There is evidence for extracapsular silicone along the LOWER INNER
QUADRANT and possibly superior portion of the LEFT breast.
IMPRESSION: No mammographic evidence of malignancy. A result letter of this
screening mammogram will be mailed directly to the patient.

Possible silicone implant rupture of the LEFT breast.

RECOMMENDATION:
Screening mammogram in one year. (Code:[id number])

Consider consultation with plastic surgeon and/or breast MRI without
contrast as needed.

BI-RADS CATEGORY  2: Benign.

## 2022-03-05 ENCOUNTER — Encounter: Payer: Self-pay | Admitting: Internal Medicine

## 2022-03-05 MED ORDER — BUSPIRONE HCL 7.5 MG PO TABS: 7.5000 mg | ORAL_TABLET | Freq: Three times a day (TID) | ORAL | 5 refills | Status: DC

## 2022-03-20 MED ORDER — BOTOX 200 UNITS IJ SOLR: INTRAMUSCULAR | 3 refills | Status: DC

## 2022-03-20 NOTE — Telephone Encounter (Signed)
Please send Botox RX to [facility]. 

## 2022-03-20 NOTE — Addendum Note (Signed)
Addended by: Bertram Savin on: 03/20/2022 12:59 PM ? ? Modules accepted: Orders ? ?

## 2022-03-28 ENCOUNTER — Other Ambulatory Visit: Payer: Self-pay | Admitting: Internal Medicine

## 2022-04-07 NOTE — Telephone Encounter (Signed)
Received (1) 200 unit vial of Botox from [facility]. ?

## 2022-04-08 ENCOUNTER — Ambulatory Visit (INDEPENDENT_AMBULATORY_CARE_PROVIDER_SITE_OTHER): Payer: 59 | Admitting: Adult Health

## 2022-04-08 DIAGNOSIS — G43709 Chronic migraine without aura, not intractable, without status migrainosus: Secondary | ICD-10-CM

## 2022-04-08 NOTE — Progress Notes (Signed)
? ?04/08/2022: Headaches increase the last two weeks before she is due. Continue with nurtec and that works well for her. ? ?01/09/22: better improvement after last round of Botox. 1 headache a week and not as severe. 1-2 weeks before botox is due headache frequency increases. Uses Nutrex for abortive and that works well.   ? ?BOTOX PROCEDURE NOTE FOR MIGRAINE HEADACHE ? ? ? ?Contraindications and precautions discussed with patient(above). Aseptic procedure was observed and patient tolerated procedure. Procedure performed by Butch Penny, NP ? ?The condition has existed for more than 6 months, and pt does not have a diagnosis of ALS, Myasthenia Gravis or Lambert-Eaton Syndrome.  Risks and benefits of injections discussed and pt agrees to proceed with the procedure.  Written consent obtained ? ?These injections are medically necessary.These injections do not cause sedations or hallucinations which the oral therapies may cause. ? ?Indication/Diagnosis: chronic migraine ?FTDDU(K0254) injection was performed according to protocol by [facility]. 200 units of BOTOX was dissolved into 4 cc NS.   ?NDC: [phone] ? ?Type of toxin: Botox ? ?Botox- 200 units x 1 vial ?Lot: [id number] ?Expiration: 10/2024 ?NDC: [phone] ?  ?Bacteriostatic 0.9% Sodium Chloride- 76mL total ?Lot: [id number] ?Expiration: 06/25/2023 ?NDC: 8546-2703-50 ?  ?Dx: G43.709 ? ? ?Description of procedure: ? ?The patient was placed in a sitting position. The standard protocol was used for Botox as follows, with 5 units of Botox injected at each site: ? ? ?-Procerus muscle, midline injection ? ?-Corrugator muscle, bilateral injection ? ?-Frontalis muscle, bilateral injection, with 2 sites each side, medial injection was performed in the upper one third of the frontalis muscle, in the region vertical from the medial inferior edge of the superior orbital rim. The lateral injection was again in the upper one third of the forehead vertically above the lateral  limbus of the cornea, 1.5 cm lateral to the medial injection site. ? ?-Temporalis muscle injection, 4 sites, bilaterally. The first injection was 3 cm above the tragus of the ear, second injection site was 1.5 cm to 3 cm up from the first injection site in line with the tragus of the ear. The third injection site was 1.5-3 cm forward between the first 2 injection sites. The fourth injection site was 1.5 cm posterior to the second injection site. ? ?-Occipitalis muscle injection, 3 sites, bilaterally. The first injection was done one half way between the occipital protuberance and the tip of the mastoid process behind the ear. The second injection site was done lateral and superior to the first, 1 fingerbreadth from the first injection. The third injection site was 1 fingerbreadth superiorly and medially from the first injection site. ? ?-Cervical paraspinal muscle injection, 2 sites, bilateral knee first injection site was 1 cm from the midline of the cervical spine, 3 cm inferior to the lower border of the occipital protuberance. The second injection site was 1.5 cm superiorly and laterally to the first injection site. ? ?-Trapezius muscle injection was performed at 3 sites, bilaterally. The first injection site was in the upper trapezius muscle halfway between the inflection point of the neck, and the acromion. The second injection site was one half way between the acromion and the first injection site. The third injection was done between the first injection site and the inflection point of the neck. ? ? ?Will return for repeat injection in 3 months. ? ? ?A 200 unit sof Botox was used, 155 units were injected, the rest of the Botox was wasted. The patient tolerated the  procedure well, there were no complications of the above procedure. ? ?Butch Penny, MSN, NP-C 04/08/2022, 9:55 AM ?[facility] ?[address], [address] ?[place], [place] [address] ?[phone] ? ?

## 2022-04-08 NOTE — Progress Notes (Signed)
Botox- 200 units x 1 vial Lot: [id number] Expiration: 10/2024 NDC: 0023-3921-02  Bacteriostatic 0.9% Sodium Chloride- 4mL total Lot: [id number] Expiration: 06/25/2023 NDC: 0409-1966-02  Dx: G43.709 S/P    

## 2022-04-18 ENCOUNTER — Encounter: Payer: Self-pay | Admitting: Internal Medicine

## 2022-04-25 MED ORDER — VENLAFAXINE HCL ER 37.5 MG PO CP24: 37.5000 mg | ORAL_CAPSULE | Freq: Every day | ORAL | 5 refills | Status: DC

## 2022-05-01 ENCOUNTER — Other Ambulatory Visit: Payer: Self-pay | Admitting: Adult Health

## 2022-05-01 DIAGNOSIS — G43709 Chronic migraine without aura, not intractable, without status migrainosus: Secondary | ICD-10-CM

## 2022-05-22 ENCOUNTER — Other Ambulatory Visit: Payer: Self-pay | Admitting: Internal Medicine

## 2022-07-01 ENCOUNTER — Ambulatory Visit (INDEPENDENT_AMBULATORY_CARE_PROVIDER_SITE_OTHER): Payer: 59 | Admitting: Adult Health

## 2022-07-01 DIAGNOSIS — G43709 Chronic migraine without aura, not intractable, without status migrainosus: Secondary | ICD-10-CM

## 2022-07-01 MED ORDER — ONABOTULINUMTOXINA 200 UNITS IJ SOLR
155.0000 [IU] | Freq: Once | INTRAMUSCULAR | Status: AC
  Administered 2022-07-01: 155 [IU] via INTRAMUSCULAR

## 2022-07-01 MED ORDER — KETOROLAC TROMETHAMINE 30 MG/ML IJ SOLN: 30.0000 mg | Freq: Once | INTRAMUSCULAR | Status: DC

## 2022-07-01 MED ORDER — KETOROLAC TROMETHAMINE 60 MG/2ML IM SOLN
60.0000 mg | Freq: Once | INTRAMUSCULAR | Status: AC
  Administered 2022-07-01: 60 mg via INTRAMUSCULAR

## 2022-07-01 NOTE — Progress Notes (Signed)
Per Aundra Millet, NP Gave patient 30 mg Toradol injection. Gave injection  in left deltoid. Waste 30mg   Sandy,RN witnessed  Place bandade on injection site . Pt sat in room for 10 minutes to make sure no reaction to medication . Pt states she was fine the patient went to check out

## 2022-07-01 NOTE — Progress Notes (Signed)
07/01/22: Reports that she has had an ongoing headache for 5 days. Gets better with nurtec but then comes back. Yesterday was the worst headache she has had in a while. Headache is better today but still lingering. Would also like to get toradol shot. Reports that she has had in the past.   04/08/2022: Headaches increase the last two weeks before she is due. Continue with nurtec and that works well for her.  01/09/22: better improvement after last round of Botox. 1 headache a week and not as severe. 1-2 weeks before botox is due headache frequency increases. Uses Nutrex for abortive and that works well.    BOTOX PROCEDURE NOTE FOR MIGRAINE HEADACHE    Contraindications and precautions discussed with patient(above). Aseptic procedure was observed and patient tolerated procedure. Procedure performed by Butch Penny, NP  The condition has existed for more than 6 months, and pt does not have a diagnosis of ALS, Myasthenia Gravis or Lambert-Eaton Syndrome.  Risks and benefits of injections discussed and pt agrees to proceed with the procedure.  Written consent obtained  These injections are medically necessary.These injections do not cause sedations or hallucinations which the oral therapies may cause.  Indication/Diagnosis: chronic migraine BOTOX(J0585) injection was performed according to protocol by [facility]. 200 units of BOTOX was dissolved into 4 cc NS.   NDC: [address]-27  Type of toxin: Botox  Botox- 200 units x 1 vial Lot: [id number] Expiration: 12/2024 NDC: 3151-7616-07   Bacteriostatic 0.9% Sodium Chloride- 49mL total Lot: [id number] Expiration: 06/25/2023 NDC: 3710-6269-48   Dx: N46.270   Description of procedure:  The patient was placed in a sitting position. The standard protocol was used for Botox as follows, with 5 units of Botox injected at each site:   -Procerus muscle, midline injection  -Corrugator muscle, bilateral injection  -Frontalis muscle, bilateral  injection, with 2 sites each side, medial injection was performed in the upper one third of the frontalis muscle, in the region vertical from the medial inferior edge of the superior orbital rim. The lateral injection was again in the upper one third of the forehead vertically above the lateral limbus of the cornea, 1.5 cm lateral to the medial injection site.  -Temporalis muscle injection, 4 sites, bilaterally. The first injection was 3 cm above the tragus of the ear, second injection site was 1.5 cm to 3 cm up from the first injection site in line with the tragus of the ear. The third injection site was 1.5-3 cm forward between the first 2 injection sites. The fourth injection site was 1.5 cm posterior to the second injection site.  -Occipitalis muscle injection, 3 sites, bilaterally. The first injection was done one half way between the occipital protuberance and the tip of the mastoid process behind the ear. The second injection site was done lateral and superior to the first, 1 fingerbreadth from the first injection. The third injection site was 1 fingerbreadth superiorly and medially from the first injection site.  -Cervical paraspinal muscle injection, 2 sites, bilateral knee first injection site was 1 cm from the midline of the cervical spine, 3 cm inferior to the lower border of the occipital protuberance. The second injection site was 1.5 cm superiorly and laterally to the first injection site.  -Trapezius muscle injection was performed at 3 sites, bilaterally. The first injection site was in the upper trapezius muscle halfway between the inflection point of the neck, and the acromion. The second injection site was one half way between the acromion and  the first injection site. The third injection was done between the first injection site and the inflection point of the neck.   Will return for repeat injection in 3 months.   A 200 unit sof Botox was used, 155 units were injected, the rest of  the Botox was wasted. The patient tolerated the procedure well, there were no complications of the above procedure.  Butch Penny, MSN, NP-C 07/01/2022, 10:06 AM [facility] [address] [place], [place] [address] [phone]

## 2022-07-01 NOTE — Progress Notes (Addendum)
Botox- 200 units x 1 vial Lot: [id number] Expiration: 12/2024 NDC: 2761-8485-92   Bacteriostatic 0.9% Sodium Chloride- 43mL total Lot: [id number] Expiration: 06/25/2023 NDC: 7639-4320-03   Dx: L94.446 SP

## 2022-07-09 ENCOUNTER — Other Ambulatory Visit: Payer: Self-pay | Admitting: Nurse Practitioner

## 2022-07-09 DIAGNOSIS — J0100 Acute maxillary sinusitis, unspecified: Secondary | ICD-10-CM

## 2022-08-05 ENCOUNTER — Other Ambulatory Visit: Payer: Self-pay | Admitting: Obstetrics and Gynecology

## 2022-08-05 DIAGNOSIS — Z1231 Encounter for screening mammogram for malignant neoplasm of breast: Secondary | ICD-10-CM

## 2022-08-15 ENCOUNTER — Encounter (INDEPENDENT_AMBULATORY_CARE_PROVIDER_SITE_OTHER): Payer: Self-pay | Admitting: Internal Medicine

## 2022-08-15 DIAGNOSIS — F419 Anxiety disorder, unspecified: Secondary | ICD-10-CM

## 2022-08-16 MED ORDER — VENLAFAXINE HCL ER 75 MG PO CP24: 75.0000 mg | ORAL_CAPSULE | Freq: Every day | ORAL | 1 refills | Status: DC

## 2022-08-16 NOTE — Telephone Encounter (Signed)
Please see the MyChart message reply.  She has anxiety and is doing well on Effexor 37.5 mg but would like to increase the dose which I think is reasonable.   Agreed to increase dose to 75 mg daily and new prescription sent into pharmacy.   The patient gave consent for this Medical Advice Message and is aware that it may result in a bill to their insurance company as well as the possibility that this may result in a co-payment or deductible. They are an established patient, but are not seeking medical advice exclusively about a problem treated during an in person or video visit in the last 7 days. I did not recommend an in person or video visit within 7 days of my reply.  I spent a total of 5 minutes cumulative time within 7 days through [place], MD

## 2022-09-13 ENCOUNTER — Other Ambulatory Visit: Payer: Self-pay | Admitting: Internal Medicine

## 2022-09-16 ENCOUNTER — Ambulatory Visit
Admission: RE | Admit: 2022-09-16 | Discharge: 2022-09-16 | Disposition: A | Discharge status: Home / Self Care | Payer: 59 | Source: Ambulatory Visit | Attending: Obstetrics and Gynecology | Admitting: Obstetrics and Gynecology

## 2022-09-16 DIAGNOSIS — Z1231 Encounter for screening mammogram for malignant neoplasm of breast: Secondary | ICD-10-CM

## 2022-09-23 ENCOUNTER — Ambulatory Visit (INDEPENDENT_AMBULATORY_CARE_PROVIDER_SITE_OTHER): Payer: 59 | Admitting: Adult Health

## 2022-09-23 ENCOUNTER — Encounter: Payer: Self-pay | Admitting: Adult Health

## 2022-09-23 DIAGNOSIS — G43709 Chronic migraine without aura, not intractable, without status migrainosus: Secondary | ICD-10-CM | POA: Diagnosis not present

## 2022-09-23 MED ORDER — ONABOTULINUMTOXINA 200 UNITS IJ SOLR
155.0000 [IU] | Freq: Once | INTRAMUSCULAR | Status: AC
  Administered 2022-09-23: 155 [IU] via INTRAMUSCULAR

## 2022-09-23 NOTE — Progress Notes (Signed)
Botox- 200 units x 1 vial Lot: [id number] Expiration: 01/2025 NDC: 2426-8341-96  Bacteriostatic 0.9% Sodium Chloride- 65mL total Lot: [id number] Expiration: 07/26/2023 NDC: 9211-9417-40  Dx: C14.481 S/P

## 2022-09-23 NOTE — Progress Notes (Signed)
09/23/22: reports headaches have been under good control. Usually has breakthrough 2 weeks before she is due. Continues to use nurtec with good benefit.   07/01/22: Reports that she has had an ongoing headache for 5 days. Gets better with nurtec but then comes back. Yesterday was the worst headache she has had in a while. Headache is better today but still lingering. Would also like to get toradol shot. Reports that she has had in the past.   04/08/2022: Headaches increase the last two weeks before she is due. Continue with nurtec and that works well for her.  01/09/22: better improvement after last round of Botox. 1 headache a week and not as severe. 1-2 weeks before botox is due headache frequency increases. Uses Nutrex for abortive and that works well.    BOTOX PROCEDURE NOTE FOR MIGRAINE HEADACHE    Contraindications and precautions discussed with patient(above). Aseptic procedure was observed and patient tolerated procedure. Procedure performed by Ward Givens, NP  The condition has existed for more than 6 months, and pt does not have a diagnosis of ALS, Myasthenia Gravis or Lambert-Eaton Syndrome.  Risks and benefits of injections discussed and pt agrees to proceed with the procedure.  Written consent obtained  These injections are medically necessary.These injections do not cause sedations or hallucinations which the oral therapies may cause.  Indication/Diagnosis: chronic migraine BOTOX(J0585) injection was performed according to protocol by [employer]. 200 units of BOTOX was dissolved into 4 cc NS.   NDC: [address]-06  Botox- 200 units x 1 vial Lot: [id number] Expiration: 01/2025 NDC: 3235-5732-20   Bacteriostatic 0.9% Sodium Chloride- 77mL total Lot: [id number] Expiration: 07/26/2023 NDC: 2376-2831-51   Dx: V61.607   Description of procedure:  The patient was placed in a sitting position. The standard protocol was used for Botox as follows, with 5 units of Botox injected at  each site:   -Procerus muscle, midline injection  -Corrugator muscle, bilateral injection  -Frontalis muscle, bilateral injection, with 2 sites each side, medial injection was performed in the upper one third of the frontalis muscle, in the region vertical from the medial inferior edge of the superior orbital rim. The lateral injection was again in the upper one third of the forehead vertically above the lateral limbus of the cornea, 1.5 cm lateral to the medial injection site.  -Temporalis muscle injection, 4 sites, bilaterally. The first injection was 3 cm above the tragus of the ear, second injection site was 1.5 cm to 3 cm up from the first injection site in line with the tragus of the ear. The third injection site was 1.5-3 cm forward between the first 2 injection sites. The fourth injection site was 1.5 cm posterior to the second injection site.  -Occipitalis muscle injection, 3 sites, bilaterally. The first injection was done one half way between the occipital protuberance and the tip of the mastoid process behind the ear. The second injection site was done lateral and superior to the first, 1 fingerbreadth from the first injection. The third injection site was 1 fingerbreadth superiorly and medially from the first injection site.  -Cervical paraspinal muscle injection, 2 sites, bilateral knee first injection site was 1 cm from the midline of the cervical spine, 3 cm inferior to the lower border of the occipital protuberance. The second injection site was 1.5 cm superiorly and laterally to the first injection site.  -Trapezius muscle injection was performed at 3 sites, bilaterally. The first injection site was in the upper trapezius muscle halfway between the  inflection point of the neck, and the acromion. The second injection site was one half way between the acromion and the first injection site. The third injection was done between the first injection site and the inflection point of the  neck.   Will return for repeat injection in 3 months.   A 200 unit sof Botox was used, 155 units were injected, the rest of the Botox was wasted. The patient tolerated the procedure well, there were no complications of the above procedure.  Butch Penny, MSN, NP-C 09/23/2022, 9:37 AM [facility] [address], [address] [place], [place] [address] [phone]

## 2022-10-26 ENCOUNTER — Encounter: Payer: Self-pay | Admitting: Internal Medicine

## 2022-10-26 DIAGNOSIS — E538 Deficiency of other specified B group vitamins: Secondary | ICD-10-CM | POA: Insufficient documentation

## 2022-10-26 NOTE — Progress Notes (Unsigned)
Subjective:    Patient ID: Summer Anderson, female    DOB: Mar 30, 1981, 40 y.o.   MRN: [id number]      HPI Summer Anderson is here for a Physical exam.   We increased the Effexor the end of September for increased anxiety.  This has helped much she is still having increased anxiety-there is a lot going on she wonders about increasing it further.    Medications and allergies reviewed with patient and updated if appropriate.  Current Outpatient Medications on File Prior to Visit  Medication Sig Dispense Refill   aspirin-acetaminophen-caffeine (EXCEDRIN MIGRAINE) 250-250-65 MG tablet Take 1 tablet by mouth every 6 (six) hours as needed for headache. 30 tablet 0   Biotin 41660 MCG TABS Take by mouth daily.     Botulinum Toxin Type A (BOTOX) 200 units SOLR Provider to inject 155 units into the muscles of the head and neck every 3 months. Discard remainder. 1 each 3   Cholecalciferol (VITAMIN D-3) 5000 units TABS Take 5,000 Units by mouth daily.     levonorgestrel (MIRENA) 20 MCG/24HR IUD 1 Intra Uterine Device (1 each total) by Intrauterine route once. 1 each 0   ondansetron (ZOFRAN-ODT) 4 MG disintegrating tablet Take 1-2 tablets (4-8 mg total) by mouth every 8 (eight) hours as needed for nausea. 30 tablet 3   Rimegepant Sulfate (NURTEC) 75 MG TBDP DISSOLVE 1 TABLET ON OR UNDER THE TONGUE AS NEEDED FOR MIGRAINE. TAKE AS CLOSE TO ONSET OF MIGRAINE AS POSSIBLE. MAX 1 TABLET PER 24 HOURS. 8 tablet 2   No current facility-administered medications on file prior to visit.    Review of Systems  Constitutional:  Negative for fever.  Eyes:  Negative for visual disturbance.  Respiratory:  Negative for cough, shortness of breath and wheezing.   Cardiovascular:  Negative for chest pain, palpitations and leg swelling.  Gastrointestinal:  Negative for abdominal pain, blood in stool, constipation, diarrhea and nausea.       No gerd  Genitourinary:  Negative for dysuria.  Musculoskeletal:  Negative for  arthralgias and back pain.  Skin:  Positive for rash.  Neurological:  Positive for headaches. Negative for light-headedness.  Psychiatric/Behavioral:  Positive for dysphoric mood. The patient is nervous/anxious.        Objective:   Vitals:   10/30/22 0811  BP: 120/78  Pulse: 94  Temp: 98.6 F (37 C)  SpO2: 99%   Filed Weights   10/30/22 0811  Weight: 152 lb (68.9 kg)   Body mass index is 26.09 kg/m.  BP Readings from Last 3 Encounters:  10/30/22 120/78  12/12/21 120/80  10/01/21 110/74    Wt Readings from Last 3 Encounters:  10/30/22 152 lb (68.9 kg)  12/12/21 139 lb 11.2 oz (63.4 kg)  10/01/21 147 lb (66.7 kg)       Physical Exam Constitutional: She appears well-developed and well-nourished. No distress.  HENT:  Head: Normocephalic and atraumatic.  Right Ear: External ear normal. Normal ear canal and TM Left Ear: External ear normal.  Normal ear canal and TM Mouth/Throat: Oropharynx is clear and moist.  Eyes: Conjunctivae normal.  Neck: Neck supple. No tracheal deviation present. No thyromegaly present.  No carotid bruit  Cardiovascular: Normal rate, regular rhythm and normal heart sounds.   No murmur heard.  No edema. Pulmonary/Chest: Effort normal and breath sounds normal. No respiratory distress. She has no wheezes. She has no rales.  Breast: deferred   Abdominal: Soft. She exhibits no distension. There is  no tenderness.  Lymphadenopathy: She has no cervical adenopathy.  Skin: Skin is warm and dry. She is not diaphoretic.  Psychiatric: She has a normal mood and affect. Her behavior is normal.     Lab Results  Component Value Date   WBC 5.1 08/05/2021   HGB 12.6 08/05/2021   HCT 38.4 08/05/2021   PLT 205 08/05/2021   GLUCOSE 90 08/05/2021   CHOL 228 (H) 08/31/2020   TRIG 89.0 08/31/2020   HDL 53.70 08/31/2020   LDLCALC 157 (H) 08/31/2020   ALT 8 08/05/2021   AST 12 08/05/2021   NA 140 08/05/2021   K 4.4 08/05/2021   CL 104 08/05/2021    CREATININE 0.80 08/05/2021   BUN 9 08/05/2021   CO2 22 08/05/2021   TSH 1.180 08/05/2021         Assessment & Plan:   Physical exam: Screening blood work  ordered Exercise  regular Weight normal Substance abuse  none   Reviewed recommended immunizations.   Health Maintenance  Topic Date Due   COVID-19 Vaccine (1) 11/15/2022 (Originally 02/09/1982)   PAP SMEAR-Modifier  10/10/2024   DTaP/Tdap/Td (3 - Td or Tdap) 06/10/2025   INFLUENZA VACCINE  Completed   HIV Screening  Completed   HPV VACCINES  Aged Out   Hepatitis C Screening  Discontinued          See Problem List for Assessment and Plan of chronic medical problems.

## 2022-10-26 NOTE — Patient Instructions (Addendum)
Blood work was ordered.   The lab is on the first floor.    Medications changes include :   effexor 150 mg daily, triamcinolone twice daily     Return in about 1 year (around 10/31/2023) for Physical Exam.    Health Maintenance, Female Adopting a healthy lifestyle and getting preventive care are important in promoting health and wellness. Ask your health care provider about: The right schedule for you to have regular tests and exams. Things you can do on your own to prevent diseases and keep yourself healthy. What should I know about diet, weight, and exercise? Eat a healthy diet  Eat a diet that includes plenty of vegetables, fruits, low-fat dairy products, and lean protein. Do not eat a lot of foods that are high in solid fats, added sugars, or sodium. Maintain a healthy weight Body mass index (BMI) is used to identify weight problems. It estimates body fat based on height and weight. Your health care provider can help determine your BMI and help you achieve or maintain a healthy weight. Get regular exercise Get regular exercise. This is one of the most important things you can do for your health. Most adults should: Exercise for at least 150 minutes each week. The exercise should increase your heart rate and make you sweat (moderate-intensity exercise). Do strengthening exercises at least twice a week. This is in addition to the moderate-intensity exercise. Spend less time sitting. Even light physical activity can be beneficial. Watch cholesterol and blood lipids Have your blood tested for lipids and cholesterol at 42 years of age, then have this test every 5 years. Have your cholesterol levels checked more often if: Your lipid or cholesterol levels are high. You are older than 42 years of age. You are at high risk for heart disease. What should I know about cancer screening? Depending on your health history and family history, you may need to have cancer screening at  various ages. This may include screening for: Breast cancer. Cervical cancer. Colorectal cancer. Skin cancer. Lung cancer. What should I know about heart disease, diabetes, and high blood pressure? Blood pressure and heart disease High blood pressure causes heart disease and increases the risk of stroke. This is more likely to develop in people who have high blood pressure readings or are overweight. Have your blood pressure checked: Every 3-5 years if you are 63-24 years of age. Every year if you are 84 years old or older. Diabetes Have regular diabetes screenings. This checks your fasting blood sugar level. Have the screening done: Once every three years after age 39 if you are at a normal weight and have a low risk for diabetes. More often and at a younger age if you are overweight or have a high risk for diabetes. What should I know about preventing infection? Hepatitis B If you have a higher risk for hepatitis B, you should be screened for this virus. Talk with your health care provider to find out if you are at risk for hepatitis B infection. Hepatitis C Testing is recommended for: Everyone born from [birth year] through 1966. Anyone with known risk factors for hepatitis C. Sexually transmitted infections (STIs) Get screened for STIs, including gonorrhea and chlamydia, if: You are sexually active and are younger than 42 years of age. You are older than 42 years of age and your health care provider tells you that you are at risk for this type of infection. Your sexual activity has changed since you  were last screened, and you are at increased risk for chlamydia or gonorrhea. Ask your health care provider if you are at risk. Ask your health care provider about whether you are at high risk for HIV. Your health care provider may recommend a prescription medicine to help prevent HIV infection. If you choose to take medicine to prevent HIV, you should first get tested for HIV. You should then be  tested every 3 months for as long as you are taking the medicine. Pregnancy If you are about to stop having your period (premenopausal) and you may become pregnant, seek counseling before you get pregnant. Take 400 to 800 micrograms (mcg) of folic acid every day if you become pregnant. Ask for birth control (contraception) if you want to prevent pregnancy. Osteoporosis and menopause Osteoporosis is a disease in which the bones lose minerals and strength with aging. This can result in bone fractures. If you are 2 years old or older, or if you are at risk for osteoporosis and fractures, ask your health care provider if you should: Be screened for bone loss. Take a calcium or vitamin D supplement to lower your risk of fractures. Be given hormone replacement therapy (HRT) to treat symptoms of menopause. Follow these instructions at home: Alcohol use Do not drink alcohol if: Your health care provider tells you not to drink. You are pregnant, may be pregnant, or are planning to become pregnant. If you drink alcohol: Limit how much you have to: 0-1 drink a day. Know how much alcohol is in your drink. In the [place] one drink equals one 12 oz bottle of beer (355 mL), one 5 oz glass of wine (148 mL), or one 1 oz glass of hard liquor (44 mL). Lifestyle Do not use any products that contain nicotine or tobacco. These products include cigarettes, chewing tobacco, and vaping devices, such as e-cigarettes. If you need help quitting, ask your health care provider. Do not use street drugs. Do not share needles. Ask your health care provider for help if you need support or information about quitting drugs. General instructions Schedule regular health, dental, and eye exams. Stay current with your vaccines. Tell your health care provider if: You often feel depressed. You have ever been abused or do not feel safe at home. Summary Adopting a healthy lifestyle and getting preventive care are important in  promoting health and wellness. Follow your health care provider's instructions about healthy diet, exercising, and getting tested or screened for diseases. Follow your health care provider's instructions on monitoring your cholesterol and blood pressure. This information is not intended to replace advice given to you by your health care provider. Make sure you discuss any questions you have with your health care provider. Document Revised: 04/01/2021 Document Reviewed: 04/01/2021 Elsevier Patient Education  [place].

## 2022-10-30 ENCOUNTER — Ambulatory Visit (INDEPENDENT_AMBULATORY_CARE_PROVIDER_SITE_OTHER): Payer: 59 | Admitting: Internal Medicine

## 2022-10-30 ENCOUNTER — Encounter: Payer: Self-pay | Admitting: Adult Health

## 2022-10-30 VITALS — BP 120/78 | HR 94 | Temp 98.6°F | Ht 64.0 in | Wt 152.0 lb

## 2022-10-30 DIAGNOSIS — Z136 Encounter for screening for cardiovascular disorders: Secondary | ICD-10-CM | POA: Diagnosis not present

## 2022-10-30 DIAGNOSIS — F419 Anxiety disorder, unspecified: Secondary | ICD-10-CM | POA: Diagnosis not present

## 2022-10-30 DIAGNOSIS — Z131 Encounter for screening for diabetes mellitus: Secondary | ICD-10-CM

## 2022-10-30 DIAGNOSIS — Z0000 Encounter for general adult medical examination without abnormal findings: Secondary | ICD-10-CM

## 2022-10-30 DIAGNOSIS — E538 Deficiency of other specified B group vitamins: Secondary | ICD-10-CM | POA: Diagnosis not present

## 2022-10-30 DIAGNOSIS — G43109 Migraine with aura, not intractable, without status migrainosus: Secondary | ICD-10-CM

## 2022-10-30 LAB — CBC WITH DIFFERENTIAL/PLATELET
Basophils Absolute: 0 10*3/uL (ref 0.0–0.1)
Basophils Relative: 0.4 % (ref 0.0–3.0)
Eosinophils Absolute: 0 10*3/uL (ref 0.0–0.7)
Eosinophils Relative: 0.4 % (ref 0.0–5.0)
HCT: 36.8 % (ref 36.0–46.0)
Hemoglobin: 12.7 g/dL (ref 12.0–15.0)
Lymphocytes Relative: 30.8 % (ref 12.0–46.0)
Lymphs Abs: 1.6 10*3/uL (ref 0.7–4.0)
MCHC: 34.5 g/dL (ref 30.0–36.0)
MCV: 95.3 fl (ref 78.0–100.0)
Monocytes Absolute: 0.5 10*3/uL (ref 0.1–1.0)
Monocytes Relative: 9.6 % (ref 3.0–12.0)
Neutro Abs: 3.1 10*3/uL (ref 1.4–7.7)
Neutrophils Relative %: 58.8 % (ref 43.0–77.0)
Platelets: 267 10*3/uL (ref 150.0–400.0)
RBC: 3.86 Mil/uL — ABNORMAL LOW (ref 3.87–5.11)
RDW: 11.4 % — ABNORMAL LOW (ref 11.5–15.5)
WBC: 5.3 10*3/uL (ref 4.0–10.5)

## 2022-10-30 LAB — COMPREHENSIVE METABOLIC PANEL
ALT: 13 U/L (ref 0–35)
AST: 13 U/L (ref 0–37)
Albumin: 4.3 g/dL (ref 3.5–5.2)
Alkaline Phosphatase: 42 U/L (ref 39–117)
BUN: 15 mg/dL (ref 6–23)
CO2: 25 mEq/L (ref 19–32)
Calcium: 8.8 mg/dL (ref 8.4–10.5)
Chloride: 108 mEq/L (ref 96–112)
Creatinine, Ser: 0.75 mg/dL (ref 0.40–1.20)
GFR: 98.34 mL/min (ref >60.00)
Glucose, Bld: 101 mg/dL — ABNORMAL HIGH (ref 70–99)
Potassium: 4 mEq/L (ref 3.5–5.1)
Sodium: 140 mEq/L (ref 135–145)
Total Bilirubin: 0.4 mg/dL (ref 0.2–1.2)
Total Protein: 7 g/dL (ref 6.0–8.3)

## 2022-10-30 LAB — LIPID PANEL
Cholesterol: 176 mg/dL (ref 0–200)
HDL: 39.7 mg/dL (ref >39.00)
LDL Cholesterol: 118 mg/dL — ABNORMAL HIGH (ref 0–99)
NonHDL: 135.97
Total CHOL/HDL Ratio: 4
Triglycerides: 92 mg/dL (ref 0.0–149.0)
VLDL: 18.4 mg/dL (ref 0.0–40.0)

## 2022-10-30 LAB — TSH: TSH: 0.88 u[IU]/mL (ref 0.35–5.50)

## 2022-10-30 LAB — HEMOGLOBIN A1C: Hgb A1c MFr Bld: 5.3 % (ref 4.6–6.5)

## 2022-10-30 LAB — VITAMIN B12: Vitamin B-12: 200 pg/mL — ABNORMAL LOW (ref 211–911)

## 2022-10-30 MED ORDER — TRIAMCINOLONE ACETONIDE 0.1 % EX CREA: 1.0000 | TOPICAL_CREAM | Freq: Two times a day (BID) | CUTANEOUS | 0 refills | Status: DC

## 2022-10-30 MED ORDER — VENLAFAXINE HCL ER 150 MG PO CP24: 150.0000 mg | ORAL_CAPSULE | Freq: Every day | ORAL | 3 refills | Status: DC

## 2022-10-30 NOTE — Assessment & Plan Note (Signed)
Chronic Following with neurology Getting Botox injections Taking Excedrin Migraine or Nurtec as needed

## 2022-10-30 NOTE — Assessment & Plan Note (Signed)
Chronic ?Check B12 level ?

## 2022-10-30 NOTE — Assessment & Plan Note (Addendum)
Chronic Not ideally controlled Experiencing increased stress and anxiety levels not ideally controlled possibly some mild depression at times Increase venlafaxine to 150 mg daily Discussed possibly seeing a therapist and she may consider doing that Try to continue and increase regular exercise if possible

## 2022-11-03 ENCOUNTER — Encounter: Payer: Self-pay | Admitting: Internal Medicine

## 2022-11-05 MED ORDER — CYANOCOBALAMIN 1000 MCG/ML IJ SOLN: 1000.0000 ug | INTRAMUSCULAR | 2 refills | Status: DC

## 2022-11-05 MED ORDER — "SYRINGE/NEEDLE (DISP) 25G X 1"" 3 ML MISC": 0 refills | Status: DC

## 2022-11-10 ENCOUNTER — Telehealth: Payer: Self-pay | Admitting: Adult Health

## 2022-11-10 NOTE — Telephone Encounter (Signed)
Pt scheduled for botox injection for 12/18/22 and will need a new PA before appointment. Previous PA expired 12/10/22

## 2022-11-13 NOTE — Telephone Encounter (Addendum)
Chronic Migraine CPT 64615  Botox J0585 Units:200  G43.709 Chronic Migraine without aura, not intractable, without status migrainous   

## 2022-11-26 ENCOUNTER — Other Ambulatory Visit (HOSPITAL_COMMUNITY): Payer: Self-pay

## 2022-11-26 MED ORDER — BOTOX 200 UNITS IJ SOLR
INTRAMUSCULAR | 3 refills | Status: DC
  Filled 2022-11-26: qty 1, fill #0
  Filled 2022-12-09: qty 1, 84d supply, fill #0
  Filled 2023-03-04: qty 1, 84d supply, fill #1

## 2022-11-26 NOTE — Addendum Note (Signed)
Addended by: Gildardo Griffes on: 11/26/2022 01:40 PM   Modules accepted: Orders

## 2022-11-26 NOTE — Telephone Encounter (Signed)
Noted. Approval info copied into appt note and Botox has been sent to [facility].

## 2022-11-26 NOTE — Telephone Encounter (Signed)
Patient Advocate Encounter  Prior Authorization for Botox 200UNIT solution has been approved.    PA# [id number] Effective dates: 11/26/2022 through 11/27/2023  Can be filled at [place], [place] Patient [place] Patient Advocate Team Direct Number: [phone]  Fax: [phone]

## 2022-11-26 NOTE — Telephone Encounter (Addendum)
Patient Advocate Encounter   Received notification that prior authorization for Botox 200UNIT solution is required.   PA submitted on 11/26/2022 Key [id number] Status is pending       Lyndel Safe, [place] Patient Advocate Specialist [place] Patient Advocate Team Direct Number: [phone]  Fax: [phone]

## 2022-12-09 ENCOUNTER — Other Ambulatory Visit (HOSPITAL_COMMUNITY): Payer: Self-pay

## 2022-12-10 ENCOUNTER — Other Ambulatory Visit (HOSPITAL_COMMUNITY): Payer: Self-pay

## 2022-12-11 ENCOUNTER — Other Ambulatory Visit: Payer: Self-pay

## 2022-12-12 ENCOUNTER — Other Ambulatory Visit (HOSPITAL_COMMUNITY): Payer: Self-pay

## 2022-12-15 ENCOUNTER — Other Ambulatory Visit (HOSPITAL_COMMUNITY): Payer: Self-pay

## 2022-12-18 ENCOUNTER — Ambulatory Visit (INDEPENDENT_AMBULATORY_CARE_PROVIDER_SITE_OTHER): Payer: No Typology Code available for payment source | Admitting: Adult Health

## 2022-12-18 ENCOUNTER — Encounter: Payer: Self-pay | Admitting: Adult Health

## 2022-12-18 DIAGNOSIS — G43709 Chronic migraine without aura, not intractable, without status migrainosus: Secondary | ICD-10-CM

## 2022-12-18 MED ORDER — ONABOTULINUMTOXINA 200 UNITS IJ SOLR
155.0000 [IU] | Freq: Once | INTRAMUSCULAR | Status: AC
  Administered 2022-12-18: 155 [IU] via INTRAMUSCULAR

## 2022-12-18 NOTE — Progress Notes (Signed)
12/18/22:  headaches are doing good. Continues to have a daily headache the two weeks before she is due for botox.   09/23/22: reports headaches have been under good control. Usually has breakthrough 2 weeks before she is due. Continues to use nurtec with good benefit.   07/01/22: Reports that she has had an ongoing headache for 5 days. Gets better with nurtec but then comes back. Yesterday was the worst headache she has had in a while. Headache is better today but still lingering. Would also like to get toradol shot. Reports that she has had in the past.   04/08/2022: Headaches increase the last two weeks before she is due. Continue with nurtec and that works well for her.  01/09/22: better improvement after last round of Botox. 1 headache a week and not as severe. 1-2 weeks before botox is due headache frequency increases. Uses Nutrex for abortive and that works well.    BOTOX PROCEDURE NOTE FOR MIGRAINE HEADACHE    Contraindications and precautions discussed with patient(above). Aseptic procedure was observed and patient tolerated procedure. Procedure performed by Ward Givens, NP  The condition has existed for more than 6 months, and pt does not have a diagnosis of ALS, Myasthenia Gravis or Lambert-Eaton Syndrome.  Risks and benefits of injections discussed and pt agrees to proceed with the procedure.  Written consent obtained  These injections are medically necessary.These injections do not cause sedations or hallucinations which the oral therapies may cause.  Indication/Diagnosis: chronic migraine BOTOX(J0585) injection was performed according to protocol by [employer]. 200 units of BOTOX was dissolved into 4 cc NS.   NDC: [address]-23  Botox- 200 units x 1 vial Lot: [id number] Expiration: 04/2025 NDC: 1540-0867-61   Bacteriostatic 0.9% Sodium Chloride- 11mL total Lot: [id number] Expiration: 09/2024 NDC: 71245-809-98   Dx: P38.250   Description of procedure:  The patient was  placed in a sitting position. The standard protocol was used for Botox as follows, with 5 units of Botox injected at each site:   -Procerus muscle, midline injection  -Corrugator muscle, bilateral injection  -Frontalis muscle, bilateral injection, with 2 sites each side, medial injection was performed in the upper one third of the frontalis muscle, in the region vertical from the medial inferior edge of the superior orbital rim. The lateral injection was again in the upper one third of the forehead vertically above the lateral limbus of the cornea, 1.5 cm lateral to the medial injection site.  -Temporalis muscle injection, 4 sites, bilaterally. The first injection was 3 cm above the tragus of the ear, second injection site was 1.5 cm to 3 cm up from the first injection site in line with the tragus of the ear. The third injection site was 1.5-3 cm forward between the first 2 injection sites. The fourth injection site was 1.5 cm posterior to the second injection site.  -Occipitalis muscle injection, 3 sites, bilaterally. The first injection was done one half way between the occipital protuberance and the tip of the mastoid process behind the ear. The second injection site was done lateral and superior to the first, 1 fingerbreadth from the first injection. The third injection site was 1 fingerbreadth superiorly and medially from the first injection site.  -Cervical paraspinal muscle injection, 2 sites, bilateral knee first injection site was 1 cm from the midline of the cervical spine, 3 cm inferior to the lower border of the occipital protuberance. The second injection site was 1.5 cm superiorly and laterally to the first injection  site.  -Trapezius muscle injection was performed at 3 sites, bilaterally. The first injection site was in the upper trapezius muscle halfway between the inflection point of the neck, and the acromion. The second injection site was one half way between the acromion and the  first injection site. The third injection was done between the first injection site and the inflection point of the neck.   Will return for repeat injection in 3 months.   A 200 unit sof Botox was used, 155 units were injected, the rest of the Botox was wasted. The patient tolerated the procedure well, there were no complications of the above procedure.  Ward Givens, MSN, NP-C 12/18/2022, 8:05 AM [facility] [address] [place], [place] [address] [phone]

## 2022-12-18 NOTE — Progress Notes (Signed)
Botox- 200 units x 1 vial Lot: [id number] Expiration: 04/2025 NDC: 6226-3335-45  Bacteriostatic 0.9% Sodium Chloride- 20mL total Lot: [id number] Expiration: 09/2024 NDC: 37342-876-81  Dx: L57.262 S/P

## 2022-12-26 ENCOUNTER — Other Ambulatory Visit: Payer: Self-pay | Admitting: Internal Medicine

## 2022-12-26 ENCOUNTER — Other Ambulatory Visit: Payer: Self-pay

## 2023-01-16 ENCOUNTER — Encounter: Payer: Self-pay | Admitting: Internal Medicine

## 2023-02-19 ENCOUNTER — Telehealth: Payer: No Typology Code available for payment source | Admitting: Family Medicine

## 2023-02-19 DIAGNOSIS — J029 Acute pharyngitis, unspecified: Secondary | ICD-10-CM | POA: Diagnosis not present

## 2023-02-19 MED ORDER — AMOXICILLIN 500 MG PO TABS: 500.0000 mg | ORAL_TABLET | Freq: Two times a day (BID) | ORAL | 0 refills | Status: AC

## 2023-02-19 NOTE — Progress Notes (Signed)

## 2023-03-03 ENCOUNTER — Other Ambulatory Visit (HOSPITAL_COMMUNITY): Payer: Self-pay

## 2023-03-04 ENCOUNTER — Other Ambulatory Visit: Payer: Self-pay

## 2023-03-04 ENCOUNTER — Other Ambulatory Visit (HOSPITAL_COMMUNITY): Payer: Self-pay

## 2023-03-05 ENCOUNTER — Other Ambulatory Visit: Payer: Self-pay

## 2023-03-12 ENCOUNTER — Encounter: Payer: Self-pay | Admitting: Adult Health

## 2023-03-12 ENCOUNTER — Ambulatory Visit (INDEPENDENT_AMBULATORY_CARE_PROVIDER_SITE_OTHER): Payer: No Typology Code available for payment source | Admitting: Adult Health

## 2023-03-12 DIAGNOSIS — G43709 Chronic migraine without aura, not intractable, without status migrainosus: Secondary | ICD-10-CM

## 2023-03-12 MED ORDER — NURTEC 75 MG PO TBDP: ORAL_TABLET | ORAL | 11 refills | Status: DC

## 2023-03-12 MED ORDER — ONABOTULINUMTOXINA 200 UNITS IJ SOLR
155.0000 [IU] | Freq: Once | INTRAMUSCULAR | Status: AC
  Administered 2023-03-12: 155 [IU] via INTRAMUSCULAR

## 2023-03-12 NOTE — Progress Notes (Signed)
12/18/22:  headaches are doing good. Continues to have a daily headache the two weeks before she is due for botox.   09/23/22: reports headaches have been under good control. Usually has breakthrough 2 weeks before she is due. Continues to use nurtec with good benefit.   07/01/22: Reports that she has had an ongoing headache for 5 days. Gets better with nurtec but then comes back. Yesterday was the worst headache she has had in a while. Headache is better today but still lingering. Would also like to get toradol shot. Reports that she has had in the past.   04/08/2022: Headaches increase the last two weeks before she is due. Continue with nurtec and that works well for her.  01/09/22: better improvement after last round of Botox. 1 headache a week and not as severe. 1-2 weeks before botox is due headache frequency increases. Uses Nutrex for abortive and that works well.    BOTOX PROCEDURE NOTE FOR MIGRAINE HEADACHE    Contraindications and precautions discussed with patient(above). Aseptic procedure was observed and patient tolerated procedure. Procedure performed by Butch Penny, NP  The condition has existed for more than 6 months, and pt does not have a diagnosis of ALS, Myasthenia Gravis or Lambert-Eaton Syndrome.  Risks and benefits of injections discussed and pt agrees to proceed with the procedure.  Written consent obtained  These injections are medically necessary.These injections do not cause sedations or hallucinations which the oral therapies may cause.  Indication/Diagnosis: chronic migraine BOTOX(J0585) injection was performed according to protocol by [employer]. 200 units of BOTOX was dissolved into 4 cc NS.   NDC: [address]-40    Patient signed Consent Botox- 200 units x 1 vial Lot: [id number] Expiration: 04/2025 NDC: 7829-5621-30   Bacteriostatic 0.9% Sodium Chloride- 4 mL total Lot: [id number] Expiration: 09/2024 NDC: 96295-284-13   Dx: K44.010      Description of  procedure:  The patient was placed in a sitting position. The standard protocol was used for Botox as follows, with 5 units of Botox injected at each site:   -Procerus muscle, midline injection  -Corrugator muscle, bilateral injection  -Frontalis muscle, bilateral injection, with 2 sites each side, medial injection was performed in the upper one third of the frontalis muscle, in the region vertical from the medial inferior edge of the superior orbital rim. The lateral injection was again in the upper one third of the forehead vertically above the lateral limbus of the cornea, 1.5 cm lateral to the medial injection site.  -Temporalis muscle injection, 4 sites, bilaterally. The first injection was 3 cm above the tragus of the ear, second injection site was 1.5 cm to 3 cm up from the first injection site in line with the tragus of the ear. The third injection site was 1.5-3 cm forward between the first 2 injection sites. The fourth injection site was 1.5 cm posterior to the second injection site.  -Occipitalis muscle injection, 3 sites, bilaterally. The first injection was done one half way between the occipital protuberance and the tip of the mastoid process behind the ear. The second injection site was done lateral and superior to the first, 1 fingerbreadth from the first injection. The third injection site was 1 fingerbreadth superiorly and medially from the first injection site.  -Cervical paraspinal muscle injection, 2 sites, bilateral knee first injection site was 1 cm from the midline of the cervical spine, 3 cm inferior to the lower border of the occipital protuberance. The second injection site was  1.5 cm superiorly and laterally to the first injection site.  -Trapezius muscle injection was performed at 3 sites, bilaterally. The first injection site was in the upper trapezius muscle halfway between the inflection point of the neck, and the acromion. The second injection site was one half way  between the acromion and the first injection site. The third injection was done between the first injection site and the inflection point of the neck.   Will return for repeat injection in 3 months.   A 200 unit sof Botox was used, 155 units were injected, the rest of the Botox was wasted. The patient tolerated the procedure well, there were no complications of the above procedure.  Butch Penny, MSN, NP-C 03/12/2023, 7:59 AM [facility] [address] [place], [place] [address] [phone]

## 2023-03-12 NOTE — Progress Notes (Signed)
Patient signed Consent Botox- 200 units x 1 vial Lot: [id number] Expiration: 04/2025 NDC: 4540-9811-91  Bacteriostatic 0.9% Sodium Chloride- 4 mL total Lot: [id number] Expiration: 09/2024 NDC: 21308-657-84  Dx: O96.295 S/P  Witnessed by Leeann Must, RN

## 2023-03-25 ENCOUNTER — Other Ambulatory Visit (HOSPITAL_COMMUNITY): Payer: Self-pay

## 2023-03-31 ENCOUNTER — Other Ambulatory Visit: Payer: Self-pay

## 2023-04-01 ENCOUNTER — Other Ambulatory Visit (HOSPITAL_COMMUNITY): Payer: Self-pay

## 2023-05-01 ENCOUNTER — Other Ambulatory Visit: Payer: Self-pay

## 2023-05-02 ENCOUNTER — Other Ambulatory Visit (HOSPITAL_COMMUNITY): Payer: Self-pay

## 2023-05-04 ENCOUNTER — Other Ambulatory Visit: Payer: Self-pay | Admitting: *Deleted

## 2023-05-04 ENCOUNTER — Other Ambulatory Visit: Payer: Self-pay

## 2023-05-04 ENCOUNTER — Other Ambulatory Visit (HOSPITAL_COMMUNITY): Payer: Self-pay

## 2023-05-04 MED ORDER — BOTOX 200 UNITS IJ SOLR
INTRAMUSCULAR | 2 refills | Status: DC
  Filled 2023-05-04: qty 1, fill #0
  Filled 2023-05-25: qty 1, 90d supply, fill #0
  Filled 2023-08-12: qty 1, 90d supply, fill #1
  Filled 2023-11-19: qty 1, 90d supply, fill #2

## 2023-05-25 ENCOUNTER — Other Ambulatory Visit (HOSPITAL_COMMUNITY): Payer: Self-pay

## 2023-05-29 ENCOUNTER — Other Ambulatory Visit (HOSPITAL_COMMUNITY): Payer: Self-pay

## 2023-06-04 ENCOUNTER — Ambulatory Visit (INDEPENDENT_AMBULATORY_CARE_PROVIDER_SITE_OTHER): Payer: No Typology Code available for payment source | Admitting: Adult Health

## 2023-06-04 DIAGNOSIS — G43709 Chronic migraine without aura, not intractable, without status migrainosus: Secondary | ICD-10-CM

## 2023-06-04 MED ORDER — ONABOTULINUMTOXINA 200 UNITS IJ SOLR
155.0000 [IU] | Freq: Once | INTRAMUSCULAR | Status: AC
  Administered 2023-06-04: 155 [IU] via INTRAMUSCULAR

## 2023-06-04 NOTE — Progress Notes (Signed)
Botox consent signed  Botox- 200 units x 1 vial Lot: [id number] Expiration: 06/2025 NDC: 2130-8657-84  Bacteriostatic 0.9% Sodium Chloride- 4 mL  Lot: [id number] Expiration: 02/23/2024 NDC: 2841-3244-01  Dx: U27.253 S/P  Witnessed by D. Broadnax CMA

## 2023-06-04 NOTE — Progress Notes (Signed)
06/04/23: Botox is working well. Has headaches the last two weeks. Advised that she could try taking nurtec every other day for the last two weeks to see if that prevents headaches.   12/18/22:  headaches are doing good. Continues to have a daily headache the two weeks before she is due for botox.   09/23/22: reports headaches have been under good control. Usually has breakthrough 2 weeks before she is due. Continues to use nurtec with good benefit.   07/01/22: Reports that she has had an ongoing headache for 5 days. Gets better with nurtec but then comes back. Yesterday was the worst headache she has had in a while. Headache is better today but still lingering. Would also like to get toradol shot. Reports that she has had in the past.   04/08/2022: Headaches increase the last two weeks before she is due. Continue with nurtec and that works well for her.  01/09/22: better improvement after last round of Botox. 1 headache a week and not as severe. 1-2 weeks before botox is due headache frequency increases. Uses Nutrex for abortive and that works well.    BOTOX PROCEDURE NOTE FOR MIGRAINE HEADACHE    Contraindications and precautions discussed with patient(above). Aseptic procedure was observed and patient tolerated procedure. Procedure performed by Butch Penny, NP  The condition has existed for more than 6 months, and pt does not have a diagnosis of ALS, Myasthenia Gravis or Lambert-Eaton Syndrome.  Risks and benefits of injections discussed and pt agrees to proceed with the procedure.  Written consent obtained  These injections are medically necessary.These injections do not cause sedations or hallucinations which the oral therapies may cause.  Indication/Diagnosis: chronic migraine BOTOX(J0585) injection was performed according to protocol by [employer]. 200 units of BOTOX was dissolved into 4 cc NS.   NDC: [address]-69  Botox consent signed   Botox- 200 units x 1 vial Lot:  [id number] Expiration: 06/2025 NDC: 1324-4010-27   Bacteriostatic 0.9% Sodium Chloride- 4 mL  Lot: [id number] Expiration: 02/23/2024 NDC: 4034-7425-95   Dx: G38.756 S/P     Description of procedure:  The patient was placed in a sitting position. The standard protocol was used for Botox as follows, with 5 units of Botox injected at each site:   -Procerus muscle, midline injection  -Corrugator muscle, bilateral injection  -Frontalis muscle, bilateral injection, with 2 sites each side, medial injection was performed in the upper one third of the frontalis muscle, in the region vertical from the medial inferior edge of the superior orbital rim. The lateral injection was again in the upper one third of the forehead vertically above the lateral limbus of the cornea, 1.5 cm lateral to the medial injection site.  -Temporalis muscle injection, 4 sites, bilaterally. The first injection was 3 cm above the tragus of the ear, second injection site was 1.5 cm to 3 cm up from the first injection site in line with the tragus of the ear. The third injection site was 1.5-3 cm forward between the first 2 injection sites. The fourth injection site was 1.5 cm posterior to the second injection site.  -Occipitalis muscle injection, 3 sites, bilaterally. The first injection was done one half way between the occipital protuberance and the tip of the mastoid process behind the ear. The second injection site was done lateral and superior to the first, 1 fingerbreadth from the first injection. The third injection site was 1 fingerbreadth superiorly and medially from the first injection site.  -Cervical paraspinal muscle injection,  2 sites, bilateral knee first injection site was 1 cm from the midline of the cervical spine, 3 cm inferior to the lower border of the occipital protuberance. The second injection site was 1.5 cm superiorly and laterally to the first injection site.  -Trapezius muscle injection was performed at  3 sites, bilaterally. The first injection site was in the upper trapezius muscle halfway between the inflection point of the neck, and the acromion. The second injection site was one half way between the acromion and the first injection site. The third injection was done between the first injection site and the inflection point of the neck.   Will return for repeat injection in 3 months.   A 200 unit sof Botox was used, 155 units were injected, the rest of the Botox was wasted. The patient tolerated the procedure well, there were no complications of the above procedure.  Butch Penny, MSN, NP-C 06/04/2023, 8:09 AM [facility] [address] [place], [place] [address] [phone]

## 2023-07-08 ENCOUNTER — Encounter (INDEPENDENT_AMBULATORY_CARE_PROVIDER_SITE_OTHER): Payer: No Typology Code available for payment source | Admitting: Internal Medicine

## 2023-07-08 DIAGNOSIS — F419 Anxiety disorder, unspecified: Secondary | ICD-10-CM

## 2023-07-08 MED ORDER — VENLAFAXINE HCL ER 37.5 MG PO CP24: ORAL_CAPSULE | ORAL | 0 refills | Status: DC

## 2023-07-08 NOTE — Telephone Encounter (Signed)

## 2023-08-03 ENCOUNTER — Telehealth: Payer: Self-pay | Admitting: Adult Health

## 2023-08-03 NOTE — Telephone Encounter (Signed)
LVM and sent mychart msg informing pt of appt change due to NP being out 

## 2023-08-05 ENCOUNTER — Other Ambulatory Visit: Payer: Self-pay | Admitting: Internal Medicine

## 2023-08-12 ENCOUNTER — Other Ambulatory Visit (HOSPITAL_COMMUNITY): Payer: Self-pay

## 2023-08-26 ENCOUNTER — Other Ambulatory Visit: Payer: Self-pay | Admitting: Obstetrics and Gynecology

## 2023-08-26 DIAGNOSIS — Z1231 Encounter for screening mammogram for malignant neoplasm of breast: Secondary | ICD-10-CM

## 2023-08-30 NOTE — Progress Notes (Unsigned)
06/04/23: Botox is working well. Has headaches the last two weeks. Advised that she could try taking nurtec every other day for the last two weeks to see if that prevents headaches.   12/18/22:  headaches are doing good. Continues to have a daily headache the two weeks before she is due for botox.   09/23/22: reports headaches have been under good control. Usually has breakthrough 2 weeks before she is due. Continues to use nurtec with good benefit.   07/01/22: Reports that she has had an ongoing headache for 5 days. Gets better with nurtec but then comes back. Yesterday was the worst headache she has had in a while. Headache is better today but still lingering. Would also like to get toradol shot. Reports that she has had in the past.   04/08/2022: Headaches increase the last two weeks before she is due. Continue with nurtec and that works well for her.  01/09/22: better improvement after last round of Botox. 1 headache a week and not as severe. 1-2 weeks before botox is due headache frequency increases. Uses Nutrex for abortive and that works well.    BOTOX PROCEDURE NOTE FOR MIGRAINE HEADACHE    Contraindications and precautions discussed with patient(above). Aseptic procedure was observed and patient tolerated procedure. Procedure performed by Butch Penny, NP  The condition has existed for more than 6 months, and pt does not have a diagnosis of ALS, Myasthenia Gravis or Lambert-Eaton Syndrome.  Risks and benefits of injections discussed and pt agrees to proceed with the procedure.  Written consent obtained  These injections are medically necessary.These injections do not cause sedations or hallucinations which the oral therapies may cause.  Indication/Diagnosis: chronic migraine BOTOX(J0585) injection was performed according to protocol by [facility]. 200 units of BOTOX was dissolved into 4 cc NS.   NDC: [address]-01  Botox consent signed   Botox- 200 units x 1 vial Lot:  [id number] Expiration: 06/2025 NDC: 6440-3474-25   Bacteriostatic 0.9% Sodium Chloride- 4 mL  Lot: [id number] Expiration: 02/23/2024 NDC: 5643-3295-18   Dx: A41.660 S/P     Description of procedure:  The patient was placed in a sitting position. The standard protocol was used for Botox as follows, with 5 units of Botox injected at each site:   -Procerus muscle, midline injection  -Corrugator muscle, bilateral injection  -Frontalis muscle, bilateral injection, with 2 sites each side, medial injection was performed in the upper one third of the frontalis muscle, in the region vertical from the medial inferior edge of the superior orbital rim. The lateral injection was again in the upper one third of the forehead vertically above the lateral limbus of the cornea, 1.5 cm lateral to the medial injection site.  -Temporalis muscle injection, 4 sites, bilaterally. The first injection was 3 cm above the tragus of the ear, second injection site was 1.5 cm to 3 cm up from the first injection site in line with the tragus of the ear. The third injection site was 1.5-3 cm forward between the first 2 injection sites. The fourth injection site was 1.5 cm posterior to the second injection site.  -Occipitalis muscle injection, 3 sites, bilaterally. The first injection was done one half way between the occipital protuberance and the tip of the mastoid process behind the ear. The second injection site was done lateral and superior to the first, 1 fingerbreadth from the first injection. The third injection site was 1 fingerbreadth superiorly and medially from the first injection site.  -Cervical paraspinal muscle injection,  2 sites, bilateral knee first injection site was 1 cm from the midline of the cervical spine, 3 cm inferior to the lower border of the occipital protuberance. The second injection site was 1.5 cm superiorly and laterally to the first injection site.  -Trapezius muscle injection was performed at  3 sites, bilaterally. The first injection site was in the upper trapezius muscle halfway between the inflection point of the neck, and the acromion. The second injection site was one half way between the acromion and the first injection site. The third injection was done between the first injection site and the inflection point of the neck.   Will return for repeat injection in 3 months.   A 200 unit sof Botox was used, 155 units were injected, the rest of the Botox was wasted. The patient tolerated the procedure well, there were no complications of the above procedure.  Butch Penny, MSN, NP-C 08/30/2023, 1:41 PM [facility] [address] [place], [place] [address] [phone]

## 2023-08-31 ENCOUNTER — Ambulatory Visit (INDEPENDENT_AMBULATORY_CARE_PROVIDER_SITE_OTHER): Payer: No Typology Code available for payment source | Admitting: Adult Health

## 2023-08-31 DIAGNOSIS — G43709 Chronic migraine without aura, not intractable, without status migrainosus: Secondary | ICD-10-CM

## 2023-08-31 MED ORDER — ONABOTULINUMTOXINA 200 UNITS IJ SOLR
155.0000 [IU] | Freq: Once | INTRAMUSCULAR | Status: AC
Start: 2023-08-31 — End: 2023-08-31
  Administered 2023-08-31: 155 [IU] via INTRAMUSCULAR

## 2023-08-31 NOTE — Progress Notes (Signed)
Botox- 200 units x 1 vial Lot: [id number] Expiration: 10/2025 NDC: 5409-8119-14  Bacteriostatic 0.9% Sodium Chloride- 4 mL  Lot: [id number] Expiration: 02/23/2024 NDC: 2130-8657-84  Dx: O96.295 S/P  Witnessed by Delmer Islam

## 2023-09-01 ENCOUNTER — Ambulatory Visit: Payer: No Typology Code available for payment source | Admitting: Adult Health

## 2023-09-18 ENCOUNTER — Ambulatory Visit
Admission: RE | Admit: 2023-09-18 | Discharge: 2023-09-18 | Disposition: A | Discharge status: Home / Self Care | Payer: No Typology Code available for payment source | Source: Ambulatory Visit | Attending: Obstetrics and Gynecology | Admitting: Obstetrics and Gynecology

## 2023-09-18 DIAGNOSIS — Z1231 Encounter for screening mammogram for malignant neoplasm of breast: Secondary | ICD-10-CM

## 2023-09-24 ENCOUNTER — Ambulatory Visit: Payer: No Typology Code available for payment source

## 2023-09-30 ENCOUNTER — Other Ambulatory Visit: Payer: Self-pay

## 2023-11-02 ENCOUNTER — Telehealth: Payer: Self-pay | Admitting: Adult Health

## 2023-11-02 NOTE — Telephone Encounter (Signed)
Received Summer Anderson renewal notice via fax from [facility]. I completed new PA for the year and received approval. Pt will continue to fill through [facility].  PA# [id number] (11/02/23-11/01/24)

## 2023-11-11 ENCOUNTER — Other Ambulatory Visit: Payer: Self-pay | Admitting: Internal Medicine

## 2023-11-19 ENCOUNTER — Other Ambulatory Visit: Payer: Self-pay

## 2023-11-19 NOTE — Progress Notes (Signed)
Specialty Pharmacy Refill Coordination Note  Summer Anderson is a 42 y.o. female contacted today regarding refills of specialty medication(s) OnabotulinumtoxinA (Botox)   Patient requested Courier to Provider Office   Delivery date: 11/30/23   Verified address: [facility] [address]   Medication will be filled on 01.03.25.

## 2023-11-26 ENCOUNTER — Other Ambulatory Visit: Payer: Self-pay

## 2023-11-27 ENCOUNTER — Other Ambulatory Visit: Payer: Self-pay

## 2023-12-06 ENCOUNTER — Encounter: Payer: Self-pay | Admitting: Internal Medicine

## 2023-12-06 NOTE — Progress Notes (Signed)
 12/08/23: Reports that botox  is working well. Except the last 3 weeks she has some debilitating headaches. Uses Nurtec for abortive but reports it doesn't always resolve the headaches. Never tried qulipta . Will tryQulipta 60 mg daily in addition to botox   08/31/23: Reports Botox  is working well for her.  The last week she has had 5 out of 7 days of migraines.  She tends to get breakthrough headaches before her next Botox  cycle however they have been more increased this past week.  She does use Nurtec for abortive therapy  06/04/23: Botox  is working well. Has headaches the last two weeks. Advised that she could try taking nurtec every other day for the last two weeks to see if that prevents headaches.   12/18/22:  headaches are doing good. Continues to have a daily headache the two weeks before she is due for botox .   09/23/22: reports headaches have been under good control. Usually has breakthrough 2 weeks before she is due. Continues to use nurtec with good benefit.   07/01/22: Reports that she has had an ongoing headache for 5 days. Gets better with nurtec but then comes back. Yesterday was the worst headache she has had in a while. Headache is better today but still lingering. Would also like to get toradol  shot. Reports that she has had in the past.   04/08/2022: Headaches increase the last two weeks before she is due. Continue with nurtec and that works well for her.  01/09/22: better improvement after last round of Botox . 1 headache a week and not as severe. 1-2 weeks before botox  is due headache frequency increases. Uses Nutrex for abortive and that works well.    BOTOX  PROCEDURE NOTE FOR MIGRAINE HEADACHE    Contraindications and precautions discussed with patient(above). Aseptic procedure was observed and patient tolerated procedure. Procedure performed by Duwaine Russell, NP  The condition has existed for more than 6 months, and pt does not have a diagnosis of ALS, Myasthenia Gravis or  Lambert-Eaton Syndrome.  Risks and benefits of injections discussed and pt agrees to proceed with the procedure.  Written consent obtained  These injections are medically necessary.These injections do not cause sedations or hallucinations which the oral therapies may cause.  Indication/Diagnosis: chronic migraine BOTOX (G9414) injection was performed according to protocol by [employer]. 200 units of BOTOX  was dissolved into 4 cc NS.   NDC: [address]-98  Botox - 200 units x 1 vial Lot: [id number] Expiration: 07/2025 NDC: 9976-6078-97   Bacteriostatic 0.9% Sodium Chloride- 4 mL  Lot: [id number] Expiration: 02/23/2024 NDC: 9590-8033-97   Dx: H56.290 S/P     Description of procedure:  The patient was placed in a sitting position. The standard protocol was used for Botox  as follows, with 5 units of Botox  injected at each site:   -Procerus muscle, midline injection  -Corrugator muscle, bilateral injection  -Frontalis muscle, bilateral injection, with 2 sites each side, medial injection was performed in the upper one third of the frontalis muscle, in the region vertical from the medial inferior edge of the superior orbital rim. The lateral injection was again in the upper one third of the forehead vertically above the lateral limbus of the cornea, 1.5 cm lateral to the medial injection site.  -Temporalis muscle injection, 4 sites, bilaterally. The first injection was 3 cm above the tragus of the ear, second injection site was 1.5 cm to 3 cm up from the first injection site in line with the tragus of the ear. The third injection site was  1.5-3 cm forward between the first 2 injection sites. The fourth injection site was 1.5 cm posterior to the second injection site.  -Occipitalis muscle injection, 3 sites, bilaterally. The first injection was done one half way between the occipital protuberance and the tip of the mastoid process behind the ear. The second injection site was done lateral and superior  to the first, 1 fingerbreadth from the first injection. The third injection site was 1 fingerbreadth superiorly and medially from the first injection site.  -Cervical paraspinal muscle injection, 2 sites, bilateral knee first injection site was 1 cm from the midline of the cervical spine, 3 cm inferior to the lower border of the occipital protuberance. The second injection site was 1.5 cm superiorly and laterally to the first injection site.  -Trapezius muscle injection was performed at 3 sites, bilaterally. The first injection site was in the upper trapezius muscle halfway between the inflection point of the neck, and the acromion. The second injection site was one half way between the acromion and the first injection site. The third injection was done between the first injection site and the inflection point of the neck.   Will return for repeat injection in 3 months.   A 200 units of Botox  was used, 155 units were injected, the rest of the Botox  was wasted. The patient tolerated the procedure well, there were no complications of the above procedure.  Duwaine Russell, MSN, NP-C 12/06/2023, 8:53 AM [facility] [address], [address] [place], [place] [address] [phone]

## 2023-12-06 NOTE — Patient Instructions (Addendum)
 Blood work was ordered.       Medications changes include :   metronidazole  ointment for face      Return in about 1 year (around 12/06/2024) for Physical Exam.    Health Maintenance, Female Adopting a healthy lifestyle and getting preventive care are important in promoting health and wellness. Ask your health care provider about: The right schedule for you to have regular tests and exams. Things you can do on your own to prevent diseases and keep yourself healthy. What should I know about diet, weight, and exercise? Eat a healthy diet  Eat a diet that includes plenty of vegetables, fruits, low-fat dairy products, and lean protein. Do not eat a lot of foods that are high in solid fats, added sugars, or sodium. Maintain a healthy weight Body mass index (BMI) is used to identify weight problems. It estimates body fat based on height and weight. Your health care provider can help determine your BMI and help you achieve or maintain a healthy weight. Get regular exercise Get regular exercise. This is one of the most important things you can do for your health. Most adults should: Exercise for at least 150 minutes each week. The exercise should increase your heart rate and make you sweat (moderate-intensity exercise). Do strengthening exercises at least twice a week. This is in addition to the moderate-intensity exercise. Spend less time sitting. Even light physical activity can be beneficial. Watch cholesterol and blood lipids Have your blood tested for lipids and cholesterol at 44 years of age, then have this test every 5 years. Have your cholesterol levels checked more often if: Your lipid or cholesterol levels are high. You are older than 44 years of age. You are at high risk for heart disease. What should I know about cancer screening? Depending on your health history and family history, you may need to have cancer screening at various ages. This may include screening  for: Breast cancer. Cervical cancer. Colorectal cancer. Skin cancer. Lung cancer. What should I know about heart disease, diabetes, and high blood pressure? Blood pressure and heart disease High blood pressure causes heart disease and increases the risk of stroke. This is more likely to develop in people who have high blood pressure readings or are overweight. Have your blood pressure checked: Every 3-5 years if you are 19-20 years of age. Every year if you are 85 years old or older. Diabetes Have regular diabetes screenings. This checks your fasting blood sugar level. Have the screening done: Once every three years after age 53 if you are at a normal weight and have a low risk for diabetes. More often and at a younger age if you are overweight or have a high risk for diabetes. What should I know about preventing infection? Hepatitis B If you have a higher risk for hepatitis B, you should be screened for this virus. Talk with your health care provider to find out if you are at risk for hepatitis B infection. Hepatitis C Testing is recommended for: Everyone born from [birth year] through 1966. Anyone with known risk factors for hepatitis C. Sexually transmitted infections (STIs) Get screened for STIs, including gonorrhea and chlamydia, if: You are sexually active and are younger than 44 years of age. You are older than 44 years of age and your health care provider tells you that you are at risk for this type of infection. Your sexual activity has changed since you were last screened, and you are at increased  risk for chlamydia or gonorrhea. Ask your health care provider if you are at risk. Ask your health care provider about whether you are at high risk for HIV. Your health care provider may recommend a prescription medicine to help prevent HIV infection. If you choose to take medicine to prevent HIV, you should first get tested for HIV. You should then be tested every 3 months for as long as you  are taking the medicine. Pregnancy If you are about to stop having your period (premenopausal) and you may become pregnant, seek counseling before you get pregnant. Take 400 to 800 micrograms (mcg) of folic acid every day if you become pregnant. Ask for birth control (contraception) if you want to prevent pregnancy. Osteoporosis and menopause Osteoporosis is a disease in which the bones lose minerals and strength with aging. This can result in bone fractures. If you are 56 years old or older, or if you are at risk for osteoporosis and fractures, ask your health care provider if you should: Be screened for bone loss. Take a calcium or vitamin D supplement to lower your risk of fractures. Be given hormone replacement therapy (HRT) to treat symptoms of menopause. Follow these instructions at home: Alcohol use Do not drink alcohol if: Your health care provider tells you not to drink. You are pregnant, may be pregnant, or are planning to become pregnant. If you drink alcohol: Limit how much you have to: 0-1 drink a day. Know how much alcohol is in your drink. In the [place] one drink equals one 12 oz bottle of beer (355 mL), one 5 oz glass of wine (148 mL), or one 1 oz glass of hard liquor (44 mL). Lifestyle Do not use any products that contain nicotine or tobacco. These products include cigarettes, chewing tobacco, and vaping devices, such as e-cigarettes. If you need help quitting, ask your health care provider. Do not use street drugs. Do not share needles. Ask your health care provider for help if you need support or information about quitting drugs. General instructions Schedule regular health, dental, and eye exams. Stay current with your vaccines. Tell your health care provider if: You often feel depressed. You have ever been abused or do not feel safe at home. Summary Adopting a healthy lifestyle and getting preventive care are important in promoting health and wellness. Follow your  health care provider's instructions about healthy diet, exercising, and getting tested or screened for diseases. Follow your health care provider's instructions on monitoring your cholesterol and blood pressure. This information is not intended to replace advice given to you by your health care provider. Make sure you discuss any questions you have with your health care provider. Document Revised: 04/01/2021 Document Reviewed: 04/01/2021 Elsevier Patient Education  2024 [employer].

## 2023-12-06 NOTE — Progress Notes (Signed)
 Subjective:    Patient ID: Summer Anderson, female    DOB: Jan 03, 1981, 43 y.o.   MRN: [id number]      HPI Summer Anderson is here for a Physical exam and her chronic medical problems.   Doing well - no concerns.  Off effexor  and doing well.   Medications and allergies reviewed with patient and updated if appropriate.  Current Outpatient Medications on File Prior to Visit  Medication Sig Dispense Refill   aspirin -acetaminophen -caffeine  (EXCEDRIN  MIGRAINE) 250-250-65 MG tablet Take 1 tablet by mouth every 6 (six) hours as needed for headache. 30 tablet 0   Biotin 89999 MCG TABS Take by mouth daily.     botulinum toxin Type A  (BOTOX ) 200 units injection Provider to inject 155 units into the muscles of the head and neck every 3 months. Discard remainder. 1 each 2   cyanocobalamin  (VITAMIN B12) 1000 MCG/ML injection INJECT 1 ML (1,000 MCG TOTAL) INTO THE MUSCLE EVERY 30 DAYS. 3 mL 9   levonorgestrel  (MIRENA ) 20 MCG/24HR IUD 1 Intra Uterine Device (1 each total) by Intrauterine route once. 1 each 0   ondansetron  (ZOFRAN -ODT) 4 MG disintegrating tablet Take 1-2 tablets (4-8 mg total) by mouth every 8 (eight) hours as needed for nausea. 30 tablet 3   Rimegepant Sulfate (NURTEC) 75 MG TBDP DISSOLVE 1 TABLET ON OR UNDER THE TONGUE AS NEEDED FOR MIGRAINE. TAKE AS CLOSE TO ONSET OF MIGRAINE AS POSSIBLE. MAX 1 TABLET PER 24 HOURS. 8 tablet 11   SYRINGE-NEEDLE, DISP, 3 ML (BD INTEGRA SYRINGE) 25G X 1 3 ML MISC USE AS DIRECTED FOR B12 INJECTIONS 50 each 0   Triamcinolone  Acetonide (TRIAMCINOLONE  0.1 % CREAM : EUCERIN) CREA Apply 1 Application topically.     No current facility-administered medications on file prior to visit.    Review of Systems  Constitutional:  Negative for fever.  Eyes:  Negative for visual disturbance.  Respiratory:  Negative for cough, shortness of breath and wheezing.   Cardiovascular:  Negative for chest pain, palpitations and leg swelling.  Gastrointestinal:  Negative for  abdominal pain, blood in stool, constipation and diarrhea.       No gerd  Genitourinary:  Negative for dysuria.  Musculoskeletal:  Negative for arthralgias and back pain.  Skin:  Positive for rash (chronic perioral dermatitis - intermittent).  Neurological:  Positive for headaches (migraines). Negative for dizziness, light-headedness and numbness.  Psychiatric/Behavioral:  Negative for dysphoric mood and sleep disturbance. The patient is not nervous/anxious.        Objective:   Vitals:   12/07/23 1341  BP: 120/80  Pulse: 69  Temp: 98.9 F (37.2 C)  SpO2: 96%   Filed Weights   12/07/23 1341  Weight: 142 lb (64.4 kg)   Body mass index is 24.37 kg/m.  BP Readings from Last 3 Encounters:  12/07/23 120/80  10/30/22 120/78  12/12/21 120/80    Wt Readings from Last 3 Encounters:  12/07/23 142 lb (64.4 kg)  10/30/22 152 lb (68.9 kg)  12/12/21 139 lb 11.2 oz (63.4 kg)       Physical Exam Constitutional: She appears well-developed and well-nourished. No distress.  HENT:  Head: Normocephalic and atraumatic.  Right Ear: External ear normal. Normal ear canal and TM Left Ear: External ear normal.  Normal ear canal and TM Mouth/Throat: Oropharynx is clear and moist.  Eyes: Conjunctivae normal.  Neck: Neck supple. No tracheal deviation present. No thyromegaly present.  No carotid bruit  Cardiovascular: Normal rate, regular rhythm and  normal heart sounds.   No murmur heard.  No edema. Pulmonary/Chest: Effort normal and breath sounds normal. No respiratory distress. She has no wheezes. She has no rales.  Breast: deferred   Abdominal: Soft. She exhibits no distension. There is no tenderness.  Lymphadenopathy: She has no cervical adenopathy.  Skin: Skin is warm and dry. She is not diaphoretic.  Psychiatric: She has a normal mood and affect. Her behavior is normal.     Lab Results  Component Value Date   WBC 5.3 10/30/2022   HGB 12.7 10/30/2022   HCT 36.8 10/30/2022    PLT 267.0 10/30/2022   GLUCOSE 101 (H) 10/30/2022   CHOL 176 10/30/2022   TRIG 92.0 10/30/2022   HDL 39.70 10/30/2022   LDLCALC 118 (H) 10/30/2022   ALT 13 10/30/2022   AST 13 10/30/2022   NA 140 10/30/2022   K 4.0 10/30/2022   CL 108 10/30/2022   CREATININE 0.75 10/30/2022   BUN 15 10/30/2022   CO2 25 10/30/2022   TSH 0.88 10/30/2022   HGBA1C 5.3 10/30/2022         Assessment & Plan:   Physical exam: Screening blood work  ordered Exercise  regular Weight  normal Substance abuse  none   Reviewed recommended immunizations.   Health Maintenance  Topic Date Due   Cervical Cancer Screening (HPV/Pap Cotest)  09/02/2021   COVID-19 Vaccine (1 - 2024-25 season) Never done   DTaP/Tdap/Td (3 - Td or Tdap) 06/10/2025   MAMMOGRAM  09/17/2025   INFLUENZA VACCINE  Completed   HIV Screening  Completed   HPV VACCINES  Aged Out   Hepatitis C Screening  Discontinued          See Problem List for Assessment and Plan of chronic medical problems.

## 2023-12-07 ENCOUNTER — Ambulatory Visit (INDEPENDENT_AMBULATORY_CARE_PROVIDER_SITE_OTHER): Payer: 59 | Admitting: Internal Medicine

## 2023-12-07 VITALS — BP 120/80 | HR 69 | Temp 98.9°F | Wt 142.0 lb

## 2023-12-07 DIAGNOSIS — F419 Anxiety disorder, unspecified: Secondary | ICD-10-CM | POA: Diagnosis not present

## 2023-12-07 DIAGNOSIS — L710 Perioral dermatitis: Secondary | ICD-10-CM | POA: Insufficient documentation

## 2023-12-07 DIAGNOSIS — E538 Deficiency of other specified B group vitamins: Secondary | ICD-10-CM

## 2023-12-07 DIAGNOSIS — G43109 Migraine with aura, not intractable, without status migrainosus: Secondary | ICD-10-CM | POA: Diagnosis not present

## 2023-12-07 DIAGNOSIS — Z0000 Encounter for general adult medical examination without abnormal findings: Secondary | ICD-10-CM

## 2023-12-07 MED ORDER — METRONIDAZOLE 0.75 % EX LOTN: TOPICAL_LOTION | CUTANEOUS | 1 refills | Status: AC

## 2023-12-07 MED ORDER — TRIAMCINOLONE ACETONIDE 0.1 % EX CREA: 1.0000 | TOPICAL_CREAM | Freq: Two times a day (BID) | CUTANEOUS | 0 refills | Status: AC

## 2023-12-07 MED ORDER — TACROLIMUS 0.03 % EX OINT: TOPICAL_OINTMENT | Freq: Two times a day (BID) | CUTANEOUS | 0 refills | Status: AC

## 2023-12-07 NOTE — Assessment & Plan Note (Addendum)
 resolved

## 2023-12-07 NOTE — Assessment & Plan Note (Signed)
Chronic Following with neurology Getting Botox injections Taking Excedrin Migraine or Nurtec as needed

## 2023-12-07 NOTE — Assessment & Plan Note (Signed)
 Chronic Intermittent Related to products and certain foods Triamcinolone works but using often - hold that Start metronidazole 0.75% ointment 1-2 times daily prn If not improving would recommend seeing derm

## 2023-12-07 NOTE — Assessment & Plan Note (Addendum)
 Chronic Doing monthly B12 injections at home Check B12 level

## 2023-12-08 ENCOUNTER — Ambulatory Visit (INDEPENDENT_AMBULATORY_CARE_PROVIDER_SITE_OTHER): Payer: 59 | Admitting: Adult Health

## 2023-12-08 DIAGNOSIS — G43709 Chronic migraine without aura, not intractable, without status migrainosus: Secondary | ICD-10-CM | POA: Diagnosis not present

## 2023-12-08 MED ORDER — QULIPTA 60 MG PO TABS: 60.0000 mg | ORAL_TABLET | Freq: Every day | ORAL | 11 refills | Status: DC

## 2023-12-08 MED ORDER — ONABOTULINUMTOXINA 200 UNITS IJ SOLR
155.0000 [IU] | Freq: Once | INTRAMUSCULAR | Status: AC
  Administered 2023-12-08: 155 [IU] via INTRAMUSCULAR

## 2023-12-08 NOTE — Patient Instructions (Signed)
 Your Plan:  Continue botox   Start Qulipta  60 mg daily  If your symptoms worsen or you develop new symptoms please let us  know.    Thank you for coming to see us  at [facility]. I hope we have been able to provide you high quality care today.  You may receive a patient satisfaction survey over the next few weeks. We would appreciate your feedback and comments so that we may continue to improve ourselves and the health of our patients.

## 2023-12-08 NOTE — Progress Notes (Signed)
 Botox- 200 units x 1 vial Lot: [id number] Expiration: 07/2025 NDC: 8413-2440-10  Bacteriostatic 0.9% Sodium Chloride- 4 mL  Lot: [id number] Expiration: 02/23/2024 NDC: 6440-3474-25  Dx: Z56.387 S/P Witnessed by Truitt Leep RN

## 2024-02-17 ENCOUNTER — Other Ambulatory Visit: Payer: Self-pay

## 2024-03-10 ENCOUNTER — Other Ambulatory Visit: Payer: Self-pay | Admitting: Pharmacy Technician

## 2024-03-10 ENCOUNTER — Other Ambulatory Visit: Payer: Self-pay

## 2024-03-10 ENCOUNTER — Other Ambulatory Visit (HOSPITAL_COMMUNITY): Payer: Self-pay

## 2024-03-10 ENCOUNTER — Other Ambulatory Visit: Payer: Self-pay | Admitting: Adult Health

## 2024-03-10 MED ORDER — BOTOX 200 UNITS IJ SOLR
INTRAMUSCULAR | 2 refills | Status: AC
  Filled 2024-03-10: qty 1, 84d supply, fill #0
  Filled 2024-06-06: qty 1, 84d supply, fill #1

## 2024-03-10 NOTE — Progress Notes (Signed)
 Specialty Pharmacy Refill Coordination Note  Summer Anderson is a 43 y.o. female contacted today regarding refills of specialty medication(s) OnabotulinumtoxinA (Botox)   Patient requested -- (Courier to MD)   Delivery date: 03/16/24   Verified address: [facility] [address], [place], [place] [address]   Medication will be filled on 03/16/24.    This fill date is pending response to refill request from provider.LVM for patient if they have not received fill by intended date they must follow up with pharmacy.

## 2024-03-16 ENCOUNTER — Other Ambulatory Visit: Payer: Self-pay

## 2024-03-22 ENCOUNTER — Ambulatory Visit (INDEPENDENT_AMBULATORY_CARE_PROVIDER_SITE_OTHER): Payer: 59 | Admitting: Adult Health

## 2024-03-22 ENCOUNTER — Other Ambulatory Visit: Payer: Self-pay | Admitting: Adult Health

## 2024-03-22 DIAGNOSIS — G43709 Chronic migraine without aura, not intractable, without status migrainosus: Secondary | ICD-10-CM

## 2024-03-22 MED ORDER — ONABOTULINUMTOXINA 200 UNITS IJ SOLR
155.0000 [IU] | Freq: Once | INTRAMUSCULAR | Status: AC
  Administered 2024-03-22: 155 [IU] via INTRAMUSCULAR

## 2024-03-22 NOTE — Progress Notes (Signed)
 03/22/24: Botox  is working well.  States that she has approximately 1 migraine a week but typically can take Excedrin  or Nurtec with good benefit.  She also has been taking Qulipta  60 mg daily that has been working well for her.  She tends to have breakthrough headaches the last 2 weeks before Botox  is due however this last 2 weeks she only had 1 migraine which is an improvement for her. Will be moving to [place].  12/08/23: Reports that botox  is working well. Except the last 3 weeks she has some debilitating headaches. Uses Nurtec for abortive but reports it doesn't always resolve the headaches. Never tried qulipta . Will tryQulipta 60 mg daily in addition to botox   08/31/23: Reports Botox  is working well for her.  The last week she has had 5 out of 7 days of migraines.  She tends to get breakthrough headaches before her next Botox  cycle however they have been more increased this past week.  She does use Nurtec for abortive therapy  06/04/23: Botox  is working well. Has headaches the last two weeks. Advised that she could try taking nurtec every other day for the last two weeks to see if that prevents headaches.   12/18/22:  headaches are doing good. Continues to have a daily headache the two weeks before she is due for botox .   09/23/22: reports headaches have been under good control. Usually has breakthrough 2 weeks before she is due. Continues to use nurtec with good benefit.   07/01/22: Reports that she has had an ongoing headache for 5 days. Gets better with nurtec but then comes back. Yesterday was the worst headache she has had in a while. Headache is better today but still lingering. Would also like to get toradol  shot. Reports that she has had in the past.   04/08/2022: Headaches increase the last two weeks before she is due. Continue with nurtec and that works well for her.  01/09/22: better improvement after last round of Botox . 1 headache a week and not as severe. 1-2 weeks before botox  is  due headache frequency increases. Uses Nutrex for abortive and that works well.    BOTOX  PROCEDURE NOTE FOR MIGRAINE HEADACHE    Contraindications and precautions discussed with patient(above). Aseptic procedure was observed and patient tolerated procedure. Procedure performed by Clem Currier, NP  The condition has existed for more than 6 months, and pt does not have a diagnosis of ALS, Myasthenia Gravis or Lambert-Eaton Syndrome.  Risks and benefits of injections discussed and pt agrees to proceed with the procedure.  Written consent obtained  These injections are medically necessary.These injections do not cause sedations or hallucinations which the oral therapies may cause.  Indication/Diagnosis: chronic migraine BOTOX (U4403) injection was performed according to protocol by [employer]. 200 units of BOTOX  was dissolved into 4 cc NS.   NDC: [address]-01  Botox - 200 units x 1 vial Lot: [id number] Expiration: 02/2026 NDC: 4742-5956-38   Bacteriostatic 0.9% Sodium Chloride- 5 mL  Lot: [id number] Expiration: 09/24/2024 NDC: 2951-8841-66   Dx: A63.016      Description of procedure:  The patient was placed in a sitting position. The standard protocol was used for Botox  as follows, with 5 units of Botox  injected at each site:   -Procerus muscle, midline injection  -Corrugator muscle, bilateral injection  -Frontalis muscle, bilateral injection, with 2 sites each side, medial injection was performed in the upper one third of the frontalis muscle, in the region vertical from the medial inferior edge of the  superior orbital rim. The lateral injection was again in the upper one third of the forehead vertically above the lateral limbus of the cornea, 1.5 cm lateral to the medial injection site.  -Temporalis muscle injection, 4 sites, bilaterally. The first injection was 3 cm above the tragus of the ear, second injection site was 1.5 cm to 3 cm up from the first injection site in line with the  tragus of the ear. The third injection site was 1.5-3 cm forward between the first 2 injection sites. The fourth injection site was 1.5 cm posterior to the second injection site.  -Occipitalis muscle injection, 3 sites, bilaterally. The first injection was done one half way between the occipital protuberance and the tip of the mastoid process behind the ear. The second injection site was done lateral and superior to the first, 1 fingerbreadth from the first injection. The third injection site was 1 fingerbreadth superiorly and medially from the first injection site.  -Cervical paraspinal muscle injection, 2 sites, bilateral knee first injection site was 1 cm from the midline of the cervical spine, 3 cm inferior to the lower border of the occipital protuberance. The second injection site was 1.5 cm superiorly and laterally to the first injection site.  -Trapezius muscle injection was performed at 3 sites, bilaterally. The first injection site was in the upper trapezius muscle halfway between the inflection point of the neck, and the acromion. The second injection site was one half way between the acromion and the first injection site. The third injection was done between the first injection site and the inflection point of the neck.   Will return for repeat injection in 3 months.   A 200 units of Botox  was used, 155 units were injected, the rest of the Botox  was wasted. The patient tolerated the procedure well, there were no complications of the above procedure.  Clem Currier, MSN, NP-C 03/22/2024, 10:36 AM [facility] [address] [place], [place] [address] [phone]

## 2024-03-22 NOTE — Progress Notes (Signed)
 Botox - 200 units x 1 vial Lot: [id number] Expiration: 02/2026 NDC: 8657-8469-62  Bacteriostatic 0.9% Sodium Chloride- 5 mL  Lot: [id number] Expiration: 09/24/2024 NDC: 3244-0102-72  Dx: Z36.644 S/P  Witnessed by Inocencio Mania

## 2024-03-31 ENCOUNTER — Telehealth: Payer: Self-pay | Admitting: *Deleted

## 2024-03-31 NOTE — Telephone Encounter (Signed)
 error

## 2024-05-23 ENCOUNTER — Telehealth: Payer: Self-pay

## 2024-05-23 ENCOUNTER — Other Ambulatory Visit (HOSPITAL_COMMUNITY): Payer: Self-pay

## 2024-05-23 ENCOUNTER — Telehealth: Payer: Self-pay | Admitting: *Deleted

## 2024-05-23 ENCOUNTER — Encounter: Payer: Self-pay | Admitting: Adult Health

## 2024-05-23 NOTE — Telephone Encounter (Signed)
 Pharmacy Patient Advocate Encounter   Received notification from Physician's Office that prior authorization for Nurtec 75MG  dispersible tablets is required/requested.   Insurance verification completed.   The patient is insured through [employer] .   Per test claim: PA required; PA submitted to above mentioned insurance via CoverMyMeds Key/confirmation #/EOC [id number] Status is pending

## 2024-05-23 NOTE — Telephone Encounter (Signed)
 Awesome, thanks. I notified the patient.

## 2024-05-23 NOTE — Telephone Encounter (Signed)
 Pharmacy Patient Advocate Encounter  Received notification from [employer] that Prior Authorization for Nurtec 75MG  dispersible tablets has been APPROVED from 05/23/2024 to 05/23/2025. Ran test claim, Copay is $0. This test claim was processed through [facility]- copay amounts may vary at other pharmacies due to pharmacy/plan contracts, or as the patient moves through the different stages of their insurance plan.   PA #/Case ID/Reference #: PA Case ID #: [id number]

## 2024-05-23 NOTE — Telephone Encounter (Signed)
 Pt requests urgent PA for Nurtec with her new [employer]. Card is uploaded in media tab.

## 2024-06-06 ENCOUNTER — Other Ambulatory Visit (HOSPITAL_COMMUNITY): Payer: Self-pay

## 2024-06-06 ENCOUNTER — Other Ambulatory Visit: Payer: Self-pay

## 2024-06-06 NOTE — Progress Notes (Unsigned)
 This medication requires a new prior authorization, and is currently being processed by the PA team. Specialty Pharmacy team will contact patient if any delays arise.

## 2024-06-07 ENCOUNTER — Other Ambulatory Visit (HOSPITAL_COMMUNITY): Payer: Self-pay

## 2024-06-07 ENCOUNTER — Telehealth: Payer: Self-pay

## 2024-06-07 NOTE — Telephone Encounter (Signed)
 All of this patient's future appts have been cancelled, she moved to [place].

## 2024-06-07 NOTE — Telephone Encounter (Signed)
    I received a message form the specialty pharmacy that PT botox  needs a PA. Looks like PT has different insurance now.

## 2024-06-09 ENCOUNTER — Other Ambulatory Visit (HOSPITAL_COMMUNITY): Payer: Self-pay

## 2024-06-18 ENCOUNTER — Encounter: Payer: Self-pay | Admitting: Adult Health

## 2024-06-20 ENCOUNTER — Other Ambulatory Visit: Payer: Self-pay | Admitting: *Deleted

## 2024-06-20 ENCOUNTER — Telehealth: Payer: Self-pay

## 2024-06-20 ENCOUNTER — Other Ambulatory Visit (HOSPITAL_COMMUNITY): Payer: Self-pay

## 2024-06-20 DIAGNOSIS — G43709 Chronic migraine without aura, not intractable, without status migrainosus: Secondary | ICD-10-CM

## 2024-06-20 MED ORDER — QULIPTA 60 MG PO TABS: 60.0000 mg | ORAL_TABLET | Freq: Every day | ORAL | 2 refills | Status: AC

## 2024-06-20 NOTE — Telephone Encounter (Signed)
 Pharmacy Patient Advocate Encounter  Received notification from [employer] that Prior Authorization for Qulipta  60MG  tablets has been APPROVED from 06/20/2024 to 06/20/2025. Ran test claim, Copay is $0. This test claim was processed through [facility]- copay amounts may vary at other pharmacies due to pharmacy/plan contracts, or as the patient moves through the different stages of their insurance plan.   PA #/Case ID/Reference #: PA Case ID #: [id number]

## 2024-06-20 NOTE — Telephone Encounter (Signed)
 LMVM for pt that relayed to her VM qulipta  approved.  [facility] 90 days supply.  With 2 refills.

## 2024-06-20 NOTE — Telephone Encounter (Addendum)
 Order done and mychart message sent to pt.  Will send to PA team for prior authorization.

## 2024-06-20 NOTE — Telephone Encounter (Signed)
 Pharmacy Patient Advocate Encounter   Received notification from RX Request Messages that prior authorization for Qulipta  60MG  tablets is required/requested.   Insurance verification completed.   The patient is insured through [employer] .   Per test claim: PA required; PA submitted to above mentioned insurance via CoverMyMeds Key/confirmation #/EOC [id number] Status is pending

## 2024-06-21 ENCOUNTER — Ambulatory Visit: Admitting: Adult Health

## 2024-06-24 ENCOUNTER — Other Ambulatory Visit: Payer: Self-pay | Admitting: Adult Health
